# Patient Record
Sex: Male | Born: 1975 | Race: Black or African American | Hispanic: No | Marital: Married | Smoking: Former smoker
Health system: Southern US, Community
[De-identification: ages and names within clinical notes are randomized; demographics above are authoritative.]

## PROBLEM LIST (undated history)

## (undated) DIAGNOSIS — I5022 Chronic systolic (congestive) heart failure: Secondary | ICD-10-CM

## (undated) DIAGNOSIS — I10 Essential (primary) hypertension: Secondary | ICD-10-CM

## (undated) DIAGNOSIS — R599 Enlarged lymph nodes, unspecified: Secondary | ICD-10-CM

## (undated) DIAGNOSIS — N183 Chronic kidney disease, stage 3 unspecified: Secondary | ICD-10-CM

## (undated) DIAGNOSIS — G4730 Sleep apnea, unspecified: Secondary | ICD-10-CM

## (undated) HISTORY — PX: FINGER SURGERY: SHX640

---

## 2005-03-02 ENCOUNTER — Emergency Department (HOSPITAL_COMMUNITY): Admission: EM | Admit: 2005-03-02 | Discharge: 2005-03-02 | Payer: Self-pay | Admitting: Emergency Medicine

## 2009-03-05 ENCOUNTER — Encounter: Payer: Self-pay | Admitting: Family Medicine

## 2009-03-05 LAB — CONVERTED CEMR LAB
Cholesterol: 170 mg/dL
Creatinine, Ser: 1.25 mg/dL
HCT: 43.6 %
HDL: 31 mg/dL
LDL Cholesterol: 89 mg/dL
Platelets: 320 10*3/uL

## 2009-03-07 ENCOUNTER — Emergency Department (HOSPITAL_COMMUNITY): Admission: EM | Admit: 2009-03-07 | Discharge: 2009-03-07 | Payer: Self-pay | Admitting: Emergency Medicine

## 2009-03-28 ENCOUNTER — Encounter (INDEPENDENT_AMBULATORY_CARE_PROVIDER_SITE_OTHER): Payer: Self-pay | Admitting: *Deleted

## 2009-04-10 ENCOUNTER — Encounter: Payer: Self-pay | Admitting: Family Medicine

## 2009-04-10 ENCOUNTER — Ambulatory Visit: Payer: Self-pay | Admitting: Family Medicine

## 2009-04-10 DIAGNOSIS — F17200 Nicotine dependence, unspecified, uncomplicated: Secondary | ICD-10-CM

## 2009-04-10 DIAGNOSIS — E669 Obesity, unspecified: Secondary | ICD-10-CM | POA: Insufficient documentation

## 2009-04-10 DIAGNOSIS — I10 Essential (primary) hypertension: Secondary | ICD-10-CM

## 2009-04-10 DIAGNOSIS — L723 Sebaceous cyst: Secondary | ICD-10-CM

## 2009-04-10 DIAGNOSIS — G4700 Insomnia, unspecified: Secondary | ICD-10-CM | POA: Insufficient documentation

## 2009-04-10 LAB — CONVERTED CEMR LAB
CO2: 28 meq/L (ref 19–32)
Chloride: 106 meq/L (ref 96–112)
Creatinine, Ser: 1.41 mg/dL (ref 0.40–1.50)

## 2009-08-28 ENCOUNTER — Ambulatory Visit: Payer: Self-pay | Admitting: Family Medicine

## 2009-08-28 ENCOUNTER — Encounter: Payer: Self-pay | Admitting: Family Medicine

## 2009-08-28 DIAGNOSIS — E781 Pure hyperglyceridemia: Secondary | ICD-10-CM

## 2009-08-28 LAB — CONVERTED CEMR LAB
Chloride: 103 meq/L (ref 96–112)
Cholesterol: 248 mg/dL
Potassium: 3.3 meq/L — ABNORMAL LOW (ref 3.5–5.3)
Sodium: 142 meq/L (ref 135–145)

## 2009-08-29 ENCOUNTER — Encounter: Payer: Self-pay | Admitting: Family Medicine

## 2010-02-13 ENCOUNTER — Emergency Department (HOSPITAL_BASED_OUTPATIENT_CLINIC_OR_DEPARTMENT_OTHER): Admission: EM | Admit: 2010-02-13 | Discharge: 2010-02-14 | Payer: Self-pay | Admitting: Emergency Medicine

## 2011-01-23 ENCOUNTER — Encounter: Payer: Self-pay | Admitting: *Deleted

## 2011-06-15 ENCOUNTER — Inpatient Hospital Stay (INDEPENDENT_AMBULATORY_CARE_PROVIDER_SITE_OTHER)
Admission: RE | Admit: 2011-06-15 | Discharge: 2011-06-15 | Disposition: A | Discharge status: Home / Self Care | Payer: Self-pay | Source: Ambulatory Visit | Attending: Emergency Medicine | Admitting: Emergency Medicine

## 2011-06-15 ENCOUNTER — Ambulatory Visit (INDEPENDENT_AMBULATORY_CARE_PROVIDER_SITE_OTHER): Payer: Self-pay

## 2011-06-15 DIAGNOSIS — K122 Cellulitis and abscess of mouth: Secondary | ICD-10-CM

## 2012-01-18 ENCOUNTER — Encounter (HOSPITAL_COMMUNITY): Payer: Self-pay | Admitting: Emergency Medicine

## 2012-01-18 ENCOUNTER — Encounter (HOSPITAL_COMMUNITY): Payer: Self-pay

## 2012-01-18 ENCOUNTER — Emergency Department (INDEPENDENT_AMBULATORY_CARE_PROVIDER_SITE_OTHER)
Admission: EM | Admit: 2012-01-18 | Discharge: 2012-01-18 | Disposition: A | Discharge status: Home / Self Care | Payer: Self-pay | Source: Home / Self Care | Attending: Emergency Medicine | Admitting: Emergency Medicine

## 2012-01-18 ENCOUNTER — Emergency Department (HOSPITAL_COMMUNITY)
Admission: EM | Admit: 2012-01-18 | Discharge: 2012-01-18 | Disposition: A | Discharge status: Home / Self Care | Payer: Self-pay | Attending: Emergency Medicine | Admitting: Emergency Medicine

## 2012-01-18 DIAGNOSIS — I10 Essential (primary) hypertension: Secondary | ICD-10-CM

## 2012-01-18 DIAGNOSIS — F17200 Nicotine dependence, unspecified, uncomplicated: Secondary | ICD-10-CM | POA: Insufficient documentation

## 2012-01-18 DIAGNOSIS — I169 Hypertensive crisis, unspecified: Secondary | ICD-10-CM

## 2012-01-18 DIAGNOSIS — R040 Epistaxis: Secondary | ICD-10-CM | POA: Insufficient documentation

## 2012-01-18 HISTORY — DX: Essential (primary) hypertension: I10

## 2012-01-18 LAB — BASIC METABOLIC PANEL
Chloride: 103 mEq/L (ref 96–112)
GFR calc non Af Amer: 57 mL/min — ABNORMAL LOW (ref >90)
Glucose, Bld: 87 mg/dL (ref 70–99)
Potassium: 3.6 mEq/L (ref 3.5–5.1)
Sodium: 143 mEq/L (ref 135–145)

## 2012-01-18 LAB — POCT I-STAT, CHEM 8
BUN: 13 mg/dL (ref 6–23)
Chloride: 103 mEq/L (ref 96–112)
Creatinine, Ser: 1.5 mg/dL — ABNORMAL HIGH (ref 0.50–1.35)
Potassium: 3 mEq/L — ABNORMAL LOW (ref 3.5–5.1)
Sodium: 145 mEq/L (ref 135–145)
TCO2: 28 mmol/L (ref 0–100)

## 2012-01-18 MED ORDER — LISINOPRIL 20 MG PO TABS: 20.0000 mg | ORAL_TABLET | Freq: Every day | ORAL | Status: DC

## 2012-01-18 MED ORDER — LISINOPRIL 10 MG PO TABS
10.0000 mg | ORAL_TABLET | ORAL | Status: AC
  Administered 2012-01-18: 10 mg via ORAL
  Filled 2012-01-18: qty 1

## 2012-01-18 NOTE — ED Notes (Signed)
nosebleeding  Since 12 noon his bp is high

## 2012-01-18 NOTE — ED Notes (Signed)
C/o nosebleed that started this am- states noted blood from both nares.  States it stopped while in [place].  BP is elevated, states has not had blood pressure in more than a year.  Denies headache or weakness.

## 2012-01-18 NOTE — ED Provider Notes (Signed)
History     CSN: [id number]  Arrival date & time 01/18/12  1344   First MD Initiated Contact with Patient 01/18/12 1636      Chief Complaint  Patient presents with  . Epistaxis    (Consider location/radiation/quality/duration/timing/severity/associated sxs/prior treatment) Patient is a 36 y.o. male presenting with nosebleeds. The history is provided by the patient and medical records.  Epistaxis    the patient is a 36 year old, male, with a history of hypertension.  He is no longer taking his antihypertensive medications.  He stated that he had copious bleeding from his nose.  This morning.  It is stopped up.  He denies shortness breath, or lightheadedness.  He is not taking any anticoagulants.  He is asymptomatic right now.  Past Medical History  Diagnosis Date  . Hypertension     Past Surgical History  Procedure Date  . Joint replacement     No family history on file.  History  Substance Use Topics  . Smoking status: Current Everyday Smoker  . Smokeless tobacco: Not on file  . Alcohol Use: No      Review of Systems  Constitutional: Negative for fever and chills.  HENT: Positive for nosebleeds.   Respiratory: Negative for cough and shortness of breath.   Cardiovascular: Negative for palpitations.  Gastrointestinal: Negative for nausea and vomiting.  Neurological: Negative for weakness, light-headedness and headaches.  Hematological: Does not bruise/bleed easily.  All other systems reviewed and are negative.    Allergies  Shellfish allergy  Home Medications   Current Outpatient Rx  Name Route Sig Dispense Refill  . IBUPROFEN 200 MG PO TABS Oral Take 400 mg by mouth every 6 (six) hours as needed. For pain      BP 168/109  Pulse 63  Temp(Src) 98.3 F (36.8 C) (Oral)  Resp 15  SpO2 98%  Physical Exam  Vitals reviewed. Constitutional: He is oriented to person, place, and time. He appears well-developed and well-nourished. No distress.  HENT:    Head: Normocephalic and atraumatic.       No epistaxis at this time  Eyes: EOM are normal. Pupils are equal, round, and reactive to light.  Neck: Normal range of motion. Neck supple.  Cardiovascular: Normal rate, regular rhythm and normal heart sounds.   No murmur heard. Pulmonary/Chest: Effort normal and breath sounds normal. No respiratory distress. He has no wheezes. He has no rales.  Abdominal: Soft. Bowel sounds are normal. He exhibits no distension and no mass. There is no tenderness. There is no rebound and no guarding.  Musculoskeletal: Normal range of motion. He exhibits no edema and no tenderness.  Neurological: He is alert and oriented to person, place, and time. No cranial nerve deficit.  Skin: Skin is warm and dry. He is not diaphoretic.  Psychiatric: He has a normal mood and affect. His behavior is normal.    ED Course  Procedures (including critical care time) 36 year old, male, presents with epistaxis.  This morning, which has resolved.  He is a normal examination.  At this time except for significant hypertension.  We will give him an ice, antihypertensive, now.  Check blood chemistry, to look and see if there is any damage to his kidney function.  There is no other evidence of endorgan damage.   Labs Reviewed  BASIC METABOLIC PANEL   No results found.   No diagnosis found.    MDM  Epistaxis, resolved Hypertension.   Mild renal insufficiency  Nicholes Stairs, MD 01/18/12 1737

## 2012-01-18 NOTE — ED Provider Notes (Signed)
History     CSN: [id number]  Arrival date & time 01/18/12  1154   First MD Initiated Contact with Patient 01/18/12 1214      Chief Complaint  Patient presents with  . Epistaxis    (Consider location/radiation/quality/duration/timing/severity/associated sxs/prior treatment) HPI Comments: Bradley Wilson presented to urgent care this morning complaining of right-sided nosebleeds. Describes yesterday while at work he felt that his nose was dry and crusted material and he blew his nose and after that he started bleeding from his right side which subsided with pressure. He worked the night shift with no further problems none until this morning the leg blow started bleeding once again from the right side of his nose again " it already stopped as he arrived to urgent care this won't.  [name] is noted to be hypertensive on further questioning describes he was seen at this urgent care about a year ago and prescribed medicines for 30 days which helped his blood pressure because it came down. No followup with any primary care doctor or return visits after that. "I have been feeling fine"  The patient denies any chest pains dizziness weakness or numbness sensation or speech problems.  Patient is a 36 y.o. male presenting with nosebleeds. The history is provided by the patient.  Epistaxis  This is a new problem. The problem has been gradually improving. The bleeding has been from the right nare. He has tried nothing (PINCHES HIS NOSE) for the symptoms. The treatment provided moderate relief. His past medical history is significant for nose-picking. His past medical history does not include bleeding disorder or frequent nosebleeds.    Past Medical History  Diagnosis Date  . Hypertension     History reviewed. No pertinent past surgical history.  No family history on file.  History  Substance Use Topics  . Smoking status: Current Everyday Smoker  . Smokeless tobacco: Not on file  . Alcohol Use:  No      Review of Systems  Constitutional: Negative for fever, appetite change and fatigue.  HENT: Positive for nosebleeds.   Respiratory: Negative for cough, shortness of breath and wheezing.   Cardiovascular: Negative for chest pain, palpitations and leg swelling.  Skin: Negative for color change.    Allergies  Review of patient's allergies indicates no known allergies.  Home Medications   Current Outpatient Rx  Name Route Sig Dispense Refill  . HYDROCHLOROTHIAZIDE 25 MG PO TABS Oral Take 25 mg by mouth daily.      Marland Kitchen METOPROLOL SUCCINATE ER 100 MG PO TB24 Oral Take 100 mg by mouth daily.        BP 174/124  Pulse 95  Temp(Src) 98.4 F (36.9 C) (Oral)  Resp 18  SpO2 100%  Physical Exam  Constitutional: He appears well-developed and well-nourished. No distress.  HENT:  Head: Normocephalic.  Nose: Mucosal edema present.    Mouth/Throat: Uvula is midline. No oropharyngeal exudate.    Eyes: Conjunctivae are normal.  Neck: Neck supple. No JVD present.  Cardiovascular: Normal rate.   Pulmonary/Chest: Effort normal and breath sounds normal.  Lymphadenopathy:    He has no cervical adenopathy.    ED Course  Procedures (including critical care time)  Labs Reviewed - No data to display No results found.   No diagnosis found.    MDM   Right-sided epistaxis subsided with coexistent hypertensive crisis on a patient with non-compliance or adherence to treatment. Patient denies any cardiovascular symptoms or complaints nor any neurological symptoms  Bradley Molly, MD 01/18/12 1252

## 2013-01-23 ENCOUNTER — Emergency Department (HOSPITAL_COMMUNITY): Payer: Self-pay

## 2013-01-23 ENCOUNTER — Encounter (HOSPITAL_COMMUNITY): Payer: Self-pay | Admitting: *Deleted

## 2013-01-23 ENCOUNTER — Emergency Department (HOSPITAL_COMMUNITY)
Admission: EM | Admit: 2013-01-23 | Discharge: 2013-01-23 | Disposition: A | Discharge status: Home / Self Care | Payer: Self-pay | Attending: Emergency Medicine | Admitting: Emergency Medicine

## 2013-01-23 DIAGNOSIS — R0989 Other specified symptoms and signs involving the circulatory and respiratory systems: Secondary | ICD-10-CM | POA: Insufficient documentation

## 2013-01-23 DIAGNOSIS — Z87891 Personal history of nicotine dependence: Secondary | ICD-10-CM | POA: Insufficient documentation

## 2013-01-23 DIAGNOSIS — N289 Disorder of kidney and ureter, unspecified: Secondary | ICD-10-CM | POA: Insufficient documentation

## 2013-01-23 DIAGNOSIS — I10 Essential (primary) hypertension: Secondary | ICD-10-CM | POA: Insufficient documentation

## 2013-01-23 DIAGNOSIS — J45901 Unspecified asthma with (acute) exacerbation: Secondary | ICD-10-CM | POA: Insufficient documentation

## 2013-01-23 DIAGNOSIS — R0609 Other forms of dyspnea: Secondary | ICD-10-CM | POA: Insufficient documentation

## 2013-01-23 DIAGNOSIS — J45909 Unspecified asthma, uncomplicated: Secondary | ICD-10-CM

## 2013-01-23 LAB — POCT I-STAT, CHEM 8
BUN: 16 mg/dL (ref 6–23)
Chloride: 104 mEq/L (ref 96–112)
Glucose, Bld: 93 mg/dL (ref 70–99)
HCT: 42 % (ref 39.0–52.0)
Potassium: 3.3 mEq/L — ABNORMAL LOW (ref 3.5–5.1)

## 2013-01-23 MED ORDER — IPRATROPIUM BROMIDE 0.02 % IN SOLN
0.5000 mg | Freq: Once | RESPIRATORY_TRACT | Status: AC
  Administered 2013-01-23: 0.5 mg via RESPIRATORY_TRACT
  Filled 2013-01-23: qty 2.5

## 2013-01-23 MED ORDER — AMLODIPINE BESYLATE 10 MG PO TABS: 10.0000 mg | ORAL_TABLET | Freq: Every day | ORAL | Status: DC

## 2013-01-23 MED ORDER — CLONIDINE HCL 0.1 MG PO TABS
0.1000 mg | ORAL_TABLET | Freq: Once | ORAL | Status: AC
  Administered 2013-01-23: 0.1 mg via ORAL
  Filled 2013-01-23: qty 1

## 2013-01-23 MED ORDER — ALBUTEROL SULFATE (5 MG/ML) 0.5% IN NEBU
5.0000 mg | INHALATION_SOLUTION | Freq: Once | RESPIRATORY_TRACT | Status: AC
  Administered 2013-01-23: 5 mg via RESPIRATORY_TRACT
  Filled 2013-01-23: qty 1

## 2013-01-23 MED ORDER — HYDROCHLOROTHIAZIDE 25 MG PO TABS: 25.0000 mg | ORAL_TABLET | Freq: Every day | ORAL | Status: DC

## 2013-01-23 MED ORDER — ALBUTEROL SULFATE HFA 108 (90 BASE) MCG/ACT IN AERS
2.0000 | INHALATION_SPRAY | RESPIRATORY_TRACT | Status: DC | PRN
  Administered 2013-01-23: 2 via RESPIRATORY_TRACT
  Filled 2013-01-23: qty 6.7

## 2013-01-23 NOTE — ED Notes (Signed)
Patient transported to X-ray 

## 2013-01-23 NOTE — ED Notes (Signed)
Pt reports wheezing and "feeling like I'm breathing through a straw." Symptoms x1 day. Pt in NAD, speaking complete sentences. Pt noted to be extremely hypertensive in triage. Sts he has a Hx of HTN, not on meds because he has to come to ED to get refills. Has not had any since July 2013. Pt's wife also reports when pt is sleeping on back he will quit breathing for 5-6 seconds, then shake leg hard and start breathing again.

## 2013-01-23 NOTE — ED Notes (Signed)
Pt states for the past month has had a productive cough w/ mucus, states took mucinex and went away then this morning started having shortness of breath again and felt like he was breathing through a straw, pt states also had wheezing, denies pain. Pt states has a hx of HTN, states has not taken his medication d/t no refills and not seeing his doctor. Pt in NAD.

## 2013-01-23 NOTE — ED Provider Notes (Signed)
History     CSN: [id number]  Arrival date & time 01/23/13  1009   First MD Initiated Contact with Patient 01/23/13 1030      Chief Complaint  Patient presents with  . Shortness of Breath    (Consider location/radiation/quality/duration/timing/severity/associated sxs/prior treatment) HPI  Patient reports about a month ago he had trouble breathing when he had a "chest cold". He states he took Mucinex and he got better. He states today he acutely got short of breath with wheezing and felt "like I'm breathing through a straw". He denies coughing or fever. He states he had a tightness in his epigastric area which he stayed restricted his breathing. He states on the way to the ED he did have the window open with the cold air, however when he shut the window in brief the warm air in the car his breathing got better.  Patient also reports he has been out of his blood pressure medicine since last July. He states he does not have insurance to have a PCP. He denies headache, swelling in his legs, or any difficulty urinating.  PCP none  Past Medical History  Diagnosis Date  . Hypertension     History reviewed. No pertinent past surgical history.  No family history on file.  History  Substance Use Topics  . Smoking status: Former Smoker    Quit date: 03/23/2012  . Smokeless tobacco: Not on file  . Alcohol Use: No   Employed Lives at home Lives with spouse   Review of Systems  All other systems reviewed and are negative.    Allergies  Shellfish allergy  Home Medications   none  BP 215/146  Pulse 103  Temp(Src) 98.4 F (36.9 C) (Oral)  SpO2 98%  Vital signs normal except hypertension, tachycardia    Physical Exam  Nursing note and vitals reviewed. Constitutional: He is oriented to person, place, and time. He appears well-developed and well-nourished.  Non-toxic appearance. He does not appear ill. No distress.  Obese   HENT:  Head: Normocephalic and atraumatic.   Right Ear: External ear normal.  Left Ear: External ear normal.  Nose: Nose normal. No mucosal edema or rhinorrhea.  Mouth/Throat: Oropharynx is clear and moist and mucous membranes are normal. No dental abscesses or edematous.  Eyes: Conjunctivae and EOM are normal. Pupils are equal, round, and reactive to light.  Neck: Normal range of motion and full passive range of motion without pain. Neck supple.  Cardiovascular: Normal rate, regular rhythm and normal heart sounds.  Exam reveals no gallop and no friction rub.   No murmur heard. Pulmonary/Chest: Effort normal. No respiratory distress. He has decreased breath sounds. He has no wheezes. He has rhonchi. He has no rales. He exhibits no tenderness and no crepitus.  Abdominal: Soft. Normal appearance and bowel sounds are normal. He exhibits no distension. There is no tenderness. There is no rebound and no guarding.  Musculoskeletal: Normal range of motion. He exhibits no edema and no tenderness.  Moves all extremities well.   Neurological: He is alert and oriented to person, place, and time. He has normal strength. No cranial nerve deficit.  Skin: Skin is warm, dry and intact. No rash noted. No erythema. No pallor.  Psychiatric: He has a normal mood and affect. His speech is normal and behavior is normal. His mood appears not anxious.    ED Course  Procedures (including critical care time)  Medications  albuterol (PROVENTIL HFA;VENTOLIN HFA) 108 (90 BASE) MCG/ACT inhaler 2  puff (not administered)  cloNIDine (CATAPRES) tablet 0.1 mg (0.1 mg Oral Given 01/23/13 1056)  albuterol (PROVENTIL) (5 MG/ML) 0.5% nebulizer solution 5 mg (5 mg Nebulization Given 01/23/13 1123)  ipratropium (ATROVENT) nebulizer solution 0.5 mg (0.5 mg Nebulization Given 01/23/13 1124)   Recheck BP 192/135, states he feels better. Lungs are clear with improved air movement. Have discussed importance of taking his medication and to get a PCP. Pt does not want to be admitted  at this time.   Results for orders placed during the hospital encounter of 01/23/13  POCT I-STAT, CHEM 8      Result Value Range   Sodium 143  135 - 145 mEq/L   Potassium 3.3 (*) 3.5 - 5.1 mEq/L   Chloride 104  96 - 112 mEq/L   BUN 16  6 - 23 mg/dL   Creatinine, Ser 1.47 (*) 0.50 - 1.35 mg/dL   Glucose, Bld 93  70 - 99 mg/dL   Calcium, Ion 8.29 (*) 1.12 - 1.23 mmol/L   TCO2 30  0 - 100 mmol/L   Hemoglobin 14.3  13.0 - 17.0 g/dL   HCT 56.2  13.0 - 86.5 %   Laboratory interpretation all normal except some worsening of his renal insuffic   Dg Chest 2 View  01/23/2013  *RADIOLOGY REPORT*  Clinical Data: Hypertension.  Smoking history.  Chest tightness. Short of breath.  CHEST - 2 VIEW  Comparison: None.  Findings: The heart is enlarged.  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  No effusions.  No bony abnormalities.  IMPRESSION: Cardiomegaly, pulmonary venous hypertension and early interstitial edema.   Original Report Authenticated By: Paulina Fusi, M.D.       Date: 01/23/2013  Rate: 96  Rhythm: normal sinus rhythm  QRS Axis: normal  Intervals: QT prolonged  ST/T Wave abnormalities: nonspecific ST/T changes  Conduction Disutrbances:LVH with strain  Narrative Interpretation:   Old EKG Reviewed: none available    1. Hypertension   2. Renal insufficiency   3. Asthma    New Prescriptions   AMLODIPINE (NORVASC) 10 MG TABLET    Take 1 tablet (10 mg total) by mouth daily.   HYDROCHLOROTHIAZIDE (HYDRODIURIL) 25 MG TABLET    Take 1 tablet (25 mg total) by mouth daily.    Plan discharge   Devoria Albe, MD, FACEP    MDM          Ward Givens, MD 01/23/13 1346

## 2013-07-29 IMAGING — CR DG CHEST 2V
3 series · 3 of 3 positions shown · non-contrast
Comparison: None.

CLINICAL DATA: Hypertension.  Smoking history.  Chest tightness.
Short of breath.

CHEST - 2 VIEW

[w chest pa]
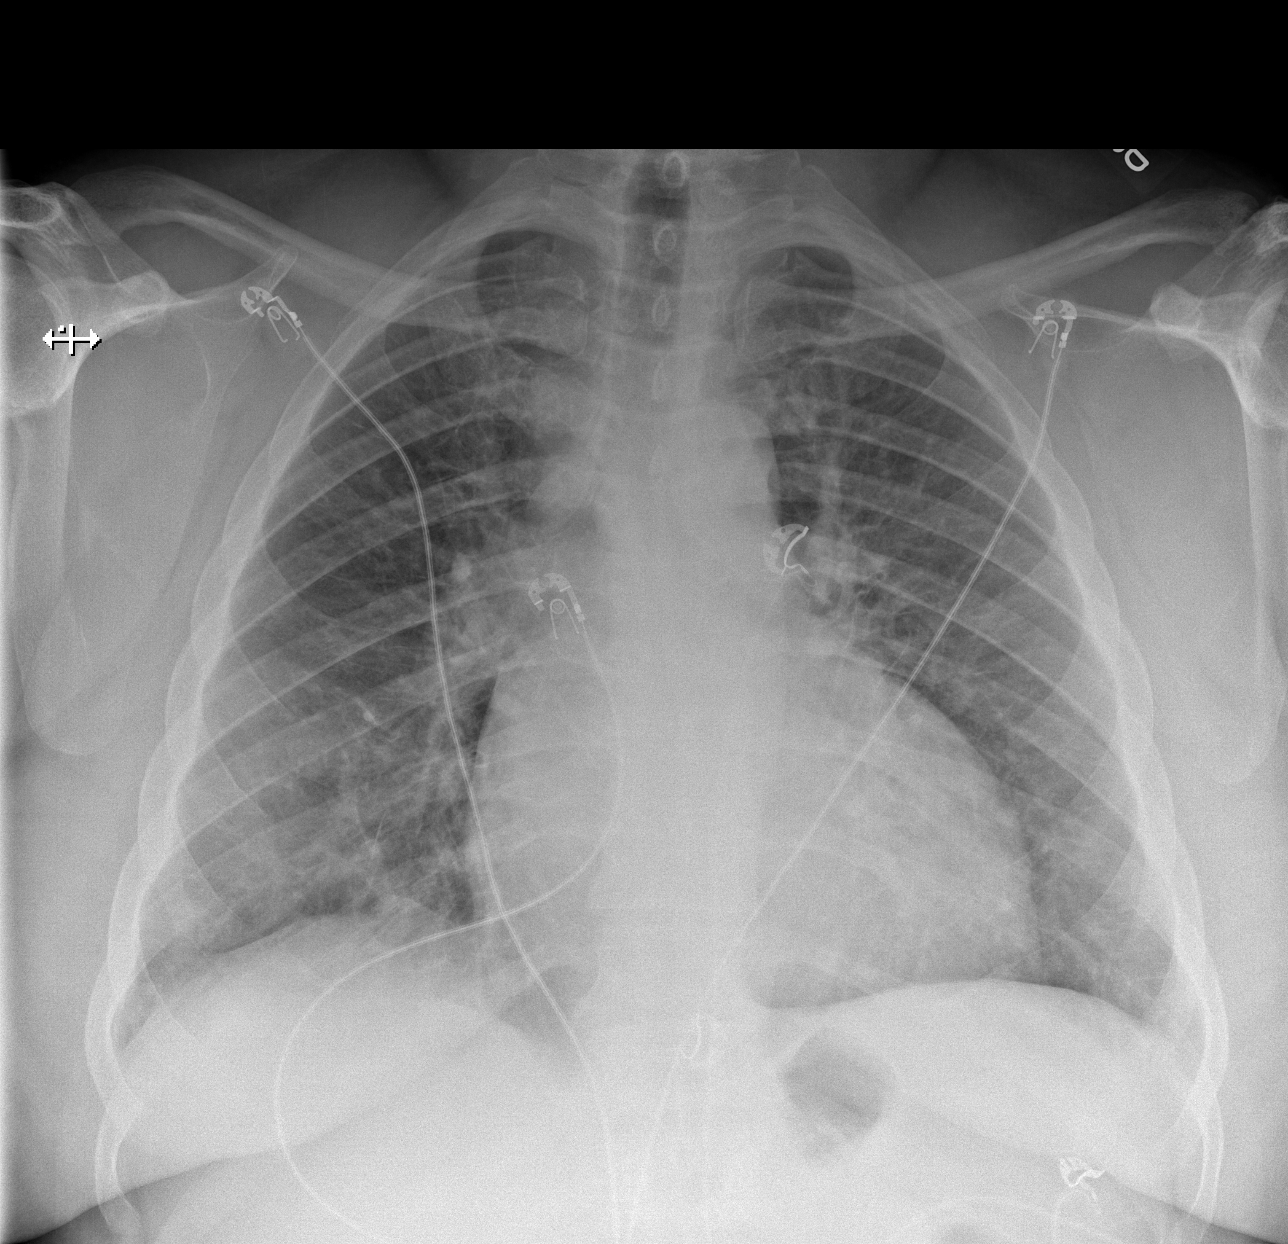

[w chest lat (1 of 2)]
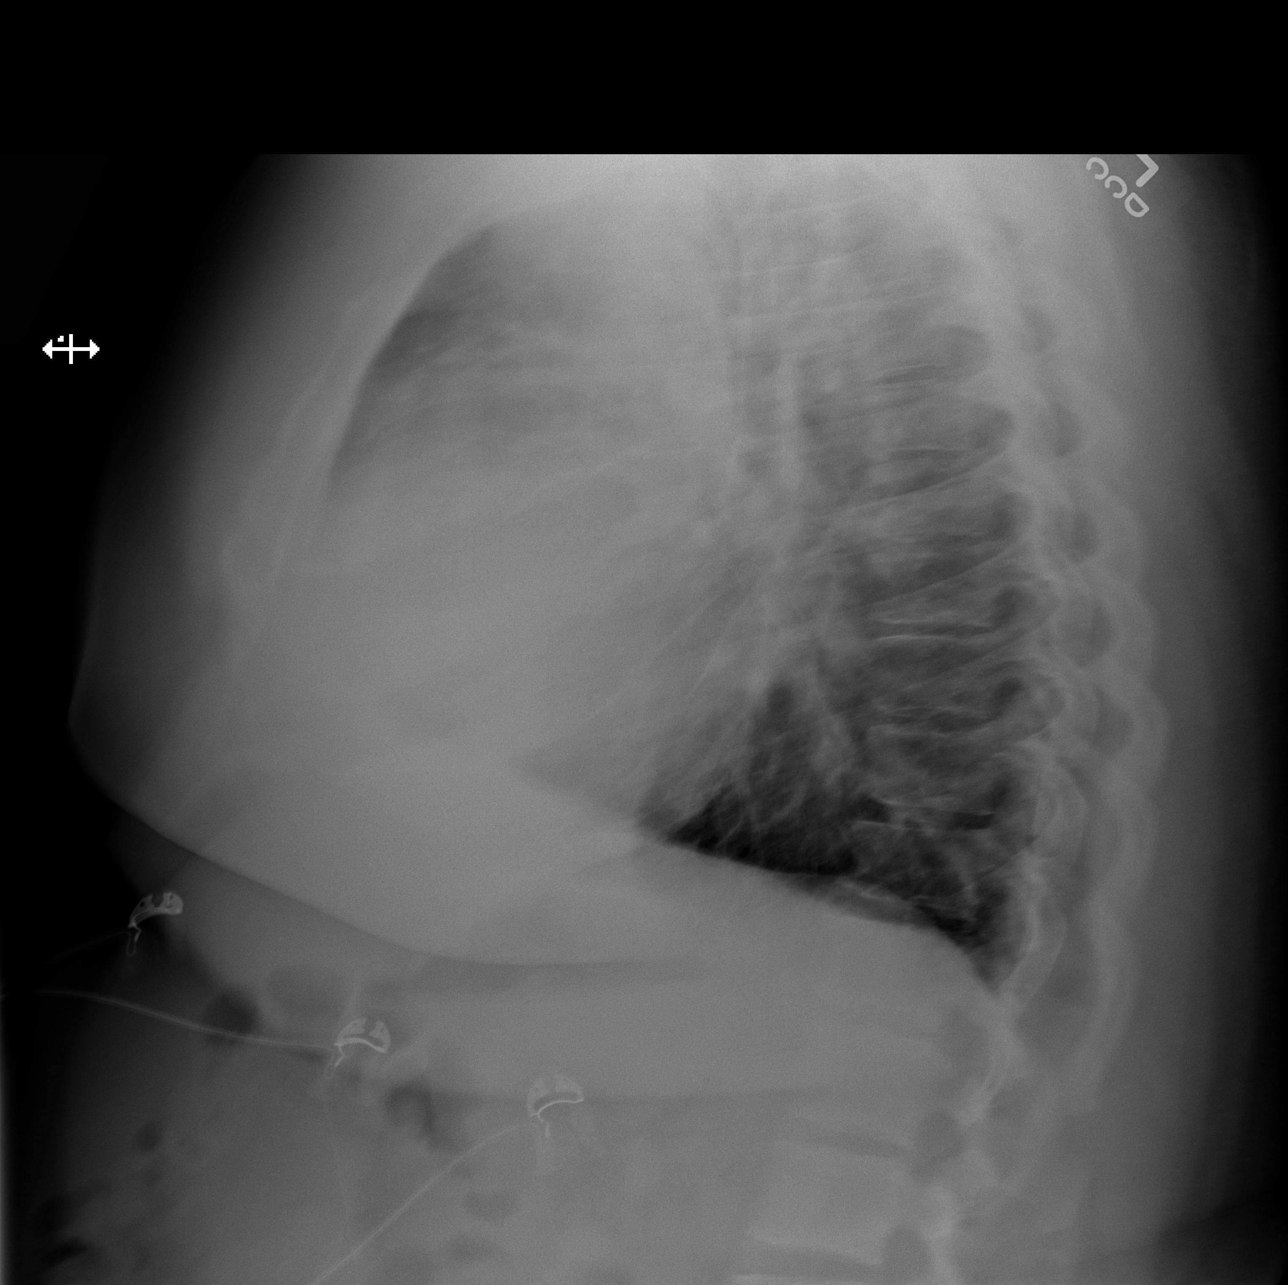

[w chest lat (2 of 2)]
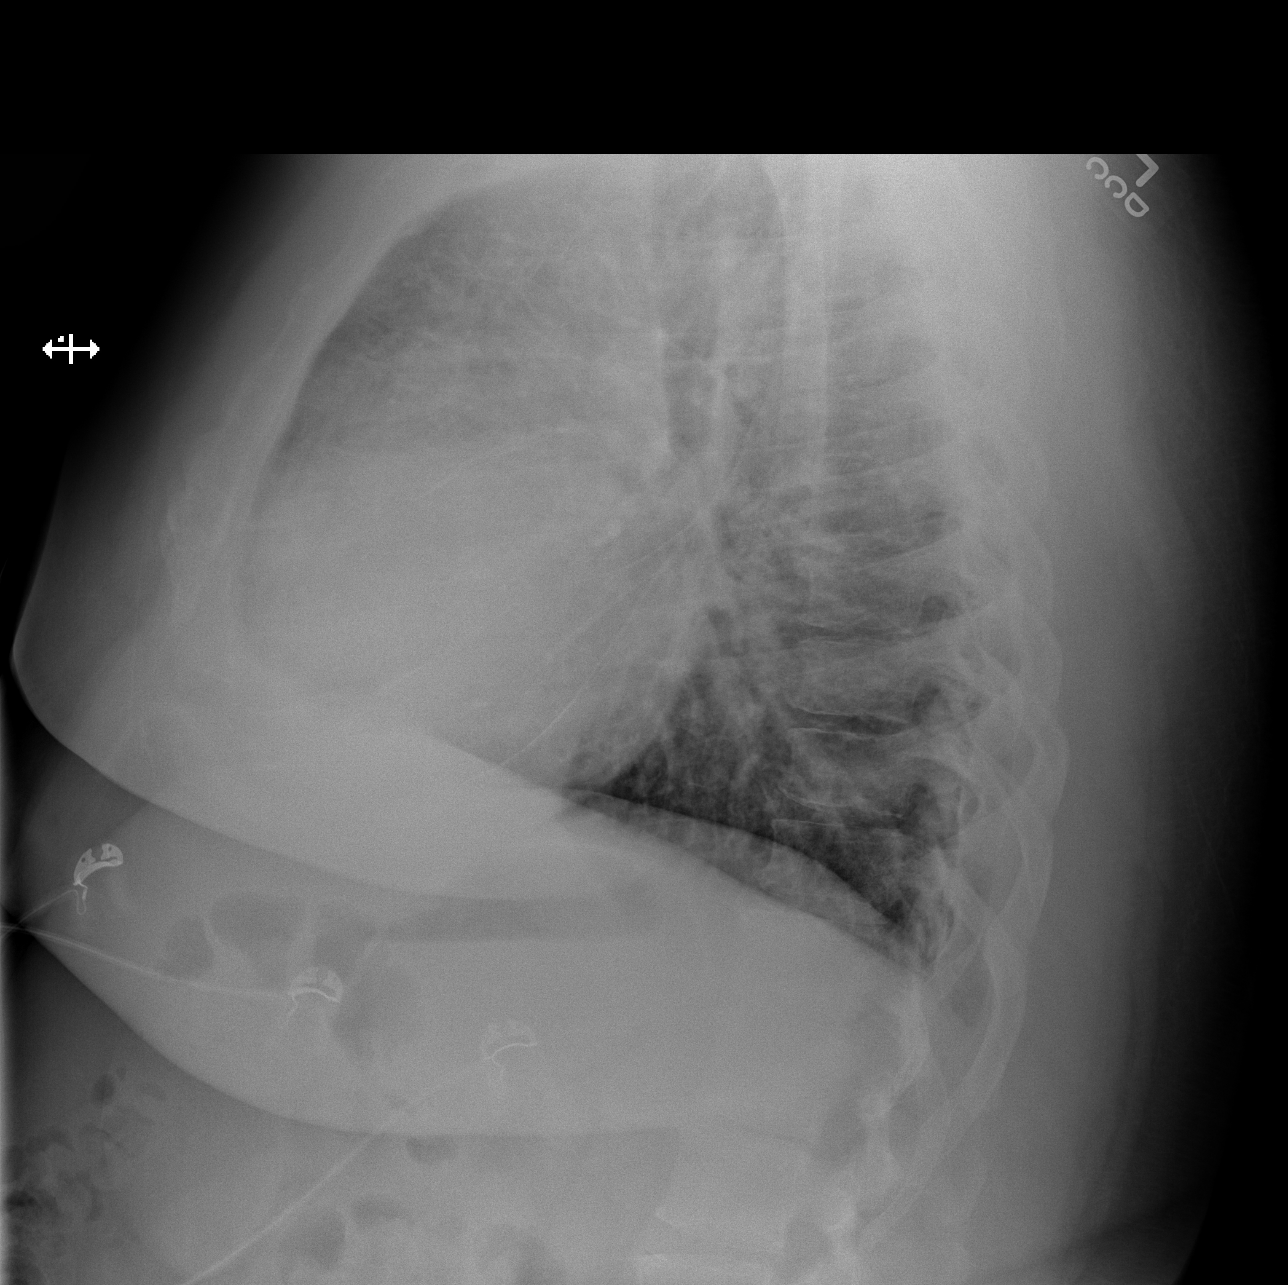

[3 of 3 positions shown; findings below may reference images not displayed]

FINDINGS: The heart is enlarged.  There is pulmonary venous
hypertension with early interstitial edema.  No effusions.  No bony
abnormalities.
IMPRESSION: Cardiomegaly, pulmonary venous hypertension and early interstitial
edema.

## 2013-12-05 ENCOUNTER — Emergency Department (HOSPITAL_COMMUNITY): Payer: Medicaid Other

## 2013-12-05 ENCOUNTER — Encounter (HOSPITAL_COMMUNITY): Payer: Self-pay | Admitting: Emergency Medicine

## 2013-12-05 ENCOUNTER — Inpatient Hospital Stay (HOSPITAL_COMMUNITY)
Admission: EM | Admit: 2013-12-05 | Discharge: 2013-12-10 | DRG: 286 | Disposition: A | Discharge status: Home / Self Care | Payer: Medicaid Other | Attending: Internal Medicine | Admitting: Internal Medicine

## 2013-12-05 DIAGNOSIS — Z91199 Patient's noncompliance with other medical treatment and regimen due to unspecified reason: Secondary | ICD-10-CM

## 2013-12-05 DIAGNOSIS — Z6841 Body Mass Index (BMI) 40.0 and over, adult: Secondary | ICD-10-CM

## 2013-12-05 DIAGNOSIS — I429 Cardiomyopathy, unspecified: Secondary | ICD-10-CM

## 2013-12-05 DIAGNOSIS — R601 Generalized edema: Secondary | ICD-10-CM

## 2013-12-05 DIAGNOSIS — I5031 Acute diastolic (congestive) heart failure: Secondary | ICD-10-CM | POA: Insufficient documentation

## 2013-12-05 DIAGNOSIS — I10 Essential (primary) hypertension: Secondary | ICD-10-CM

## 2013-12-05 DIAGNOSIS — G4733 Obstructive sleep apnea (adult) (pediatric): Secondary | ICD-10-CM | POA: Diagnosis present

## 2013-12-05 DIAGNOSIS — F17200 Nicotine dependence, unspecified, uncomplicated: Secondary | ICD-10-CM

## 2013-12-05 DIAGNOSIS — R0989 Other specified symptoms and signs involving the circulatory and respiratory systems: Secondary | ICD-10-CM

## 2013-12-05 DIAGNOSIS — E781 Pure hyperglyceridemia: Secondary | ICD-10-CM | POA: Diagnosis present

## 2013-12-05 DIAGNOSIS — R0609 Other forms of dyspnea: Secondary | ICD-10-CM

## 2013-12-05 DIAGNOSIS — Z23 Encounter for immunization: Secondary | ICD-10-CM

## 2013-12-05 DIAGNOSIS — I428 Other cardiomyopathies: Secondary | ICD-10-CM | POA: Diagnosis present

## 2013-12-05 DIAGNOSIS — Z9119 Patient's noncompliance with other medical treatment and regimen: Secondary | ICD-10-CM

## 2013-12-05 DIAGNOSIS — N179 Acute kidney failure, unspecified: Secondary | ICD-10-CM | POA: Diagnosis present

## 2013-12-05 DIAGNOSIS — I161 Hypertensive emergency: Secondary | ICD-10-CM

## 2013-12-05 DIAGNOSIS — E669 Obesity, unspecified: Secondary | ICD-10-CM

## 2013-12-05 DIAGNOSIS — I130 Hypertensive heart and chronic kidney disease with heart failure and stage 1 through stage 4 chronic kidney disease, or unspecified chronic kidney disease: Principal | ICD-10-CM | POA: Diagnosis present

## 2013-12-05 DIAGNOSIS — I5021 Acute systolic (congestive) heart failure: Secondary | ICD-10-CM

## 2013-12-05 DIAGNOSIS — N183 Chronic kidney disease, stage 3 unspecified: Secondary | ICD-10-CM | POA: Diagnosis present

## 2013-12-05 DIAGNOSIS — Z87891 Personal history of nicotine dependence: Secondary | ICD-10-CM

## 2013-12-05 DIAGNOSIS — R0600 Dyspnea, unspecified: Secondary | ICD-10-CM

## 2013-12-05 DIAGNOSIS — I509 Heart failure, unspecified: Secondary | ICD-10-CM | POA: Diagnosis present

## 2013-12-05 DIAGNOSIS — Z8249 Family history of ischemic heart disease and other diseases of the circulatory system: Secondary | ICD-10-CM

## 2013-12-05 HISTORY — DX: Enlarged lymph nodes, unspecified: R59.9

## 2013-12-05 HISTORY — DX: Chronic systolic (congestive) heart failure: I50.22

## 2013-12-05 HISTORY — DX: Sleep apnea, unspecified: G47.30

## 2013-12-05 HISTORY — DX: Chronic kidney disease, stage 3 unspecified: N18.30

## 2013-12-05 HISTORY — DX: Chronic kidney disease, stage 3 (moderate): N18.3

## 2013-12-05 HISTORY — DX: Morbid (severe) obesity due to excess calories: E66.01

## 2013-12-05 LAB — COMPREHENSIVE METABOLIC PANEL
ALT: 37 U/L (ref 0–53)
AST: 35 U/L (ref 0–37)
Albumin: 3.6 g/dL (ref 3.5–5.2)
Alkaline Phosphatase: 56 U/L (ref 39–117)
BUN: 21 mg/dL (ref 6–23)
CO2: 28 mEq/L (ref 19–32)
Calcium: 8.9 mg/dL (ref 8.4–10.5)
Chloride: 100 mEq/L (ref 96–112)
Creatinine, Ser: 1.73 mg/dL — ABNORMAL HIGH (ref 0.50–1.35)
GFR calc Af Amer: 56 mL/min — ABNORMAL LOW (ref >90)
GFR calc non Af Amer: 48 mL/min — ABNORMAL LOW (ref >90)
Glucose, Bld: 96 mg/dL (ref 70–99)
Potassium: 3.9 mEq/L (ref 3.7–5.3)
Sodium: 141 mEq/L (ref 137–147)
Total Bilirubin: 0.6 mg/dL (ref 0.3–1.2)
Total Protein: 7.3 g/dL (ref 6.0–8.3)

## 2013-12-05 LAB — TROPONIN I: Troponin I: 0.34 ng/mL (ref <0.30)

## 2013-12-05 LAB — PRO B NATRIURETIC PEPTIDE: Pro B Natriuretic peptide (BNP): 2630 pg/mL — ABNORMAL HIGH (ref 0–125)

## 2013-12-05 LAB — CBC
HCT: 42.7 % (ref 39.0–52.0)
Hemoglobin: 14.1 g/dL (ref 13.0–17.0)
MCH: 27.9 pg (ref 26.0–34.0)
MCHC: 33 g/dL (ref 30.0–36.0)
MCHC: 33.4 g/dL (ref 30.0–36.0)
MCV: 84.6 fL (ref 78.0–100.0)
MCV: 84.3 fL (ref 78.0–100.0)
Platelets: 297 10*3/uL (ref 150–400)
Platelets: 330 10*3/uL (ref 150–400)
RBC: 5.05 MIL/uL (ref 4.22–5.81)
RDW: 14 % (ref 11.5–15.5)
RDW: 13.9 % (ref 11.5–15.5)
WBC: 9.8 10*3/uL (ref 4.0–10.5)
WBC: 9.9 10*3/uL (ref 4.0–10.5)

## 2013-12-05 LAB — BASIC METABOLIC PANEL
CO2: 30 mEq/L (ref 19–32)
Chloride: 102 mEq/L (ref 96–112)
GFR calc Af Amer: 63 mL/min — ABNORMAL LOW (ref >90)
Potassium: 3.5 mEq/L — ABNORMAL LOW (ref 3.7–5.3)
Sodium: 145 mEq/L (ref 137–147)

## 2013-12-05 LAB — MAGNESIUM: Magnesium: 2.2 mg/dL (ref 1.5–2.5)

## 2013-12-05 MED ORDER — ONDANSETRON HCL 4 MG/2ML IJ SOLN: 4.0000 mg | Freq: Four times a day (QID) | INTRAMUSCULAR | Status: DC | PRN

## 2013-12-05 MED ORDER — NITROGLYCERIN IN D5W 200-5 MCG/ML-% IV SOLN
2.0000 ug/min | Freq: Once | INTRAVENOUS | Status: AC
  Administered 2013-12-05: 20 ug/min via INTRAVENOUS
  Filled 2013-12-05: qty 250

## 2013-12-05 MED ORDER — ASPIRIN EC 81 MG PO TBEC
81.0000 mg | DELAYED_RELEASE_TABLET | Freq: Every day | ORAL | Status: DC
  Administered 2013-12-05 – 2013-12-10 (×6): 81 mg via ORAL
  Filled 2013-12-05 (×6): qty 1

## 2013-12-05 MED ORDER — CARVEDILOL 6.25 MG PO TABS: 6.2500 mg | ORAL_TABLET | Freq: Two times a day (BID) | ORAL | Status: DC

## 2013-12-05 MED ORDER — ZOLPIDEM TARTRATE 5 MG PO TABS: 5.0000 mg | ORAL_TABLET | Freq: Every evening | ORAL | Status: DC | PRN

## 2013-12-05 MED ORDER — ASPIRIN 81 MG PO CHEW
324.0000 mg | CHEWABLE_TABLET | Freq: Once | ORAL | Status: AC
  Administered 2013-12-05: 324 mg via ORAL
  Filled 2013-12-05: qty 4

## 2013-12-05 MED ORDER — LISINOPRIL 10 MG PO TABS
10.0000 mg | ORAL_TABLET | Freq: Every day | ORAL | Status: DC
  Administered 2013-12-06 – 2013-12-10 (×5): 10 mg via ORAL
  Filled 2013-12-05 (×5): qty 1

## 2013-12-05 MED ORDER — ACETAMINOPHEN 325 MG PO TABS
650.0000 mg | ORAL_TABLET | ORAL | Status: DC | PRN
  Administered 2013-12-05: 650 mg via ORAL
  Filled 2013-12-05: qty 2

## 2013-12-05 MED ORDER — NITROGLYCERIN IN D5W 200-5 MCG/ML-% IV SOLN
2.0000 ug/min | INTRAVENOUS | Status: DC
  Administered 2013-12-05: 10 ug/min via INTRAVENOUS

## 2013-12-05 MED ORDER — LABETALOL HCL 5 MG/ML IV SOLN
0.5000 mg/min | INTRAVENOUS | Status: DC
  Administered 2013-12-05: 1 mg/min via INTRAVENOUS
  Administered 2013-12-05: 1.5 mg/min via INTRAVENOUS
  Filled 2013-12-05: qty 100

## 2013-12-05 MED ORDER — INFLUENZA VAC SPLIT QUAD 0.5 ML IM SUSP
0.5000 mL | INTRAMUSCULAR | Status: AC
  Administered 2013-12-06: 0.5 mL via INTRAMUSCULAR
  Filled 2013-12-05: qty 0.5

## 2013-12-05 MED ORDER — SODIUM CHLORIDE 0.9 % IV SOLN
250.0000 mL | INTRAVENOUS | Status: DC | PRN
  Administered 2013-12-05: 10 mL/h via INTRAVENOUS

## 2013-12-05 MED ORDER — SODIUM CHLORIDE 0.9 % IJ SOLN
3.0000 mL | INTRAMUSCULAR | Status: DC | PRN
  Administered 2013-12-06: 3 mL via INTRAVENOUS

## 2013-12-05 MED ORDER — ALPRAZOLAM 0.25 MG PO TABS: 0.2500 mg | ORAL_TABLET | Freq: Two times a day (BID) | ORAL | Status: DC | PRN

## 2013-12-05 MED ORDER — FUROSEMIDE 10 MG/ML IJ SOLN
80.0000 mg | Freq: Two times a day (BID) | INTRAMUSCULAR | Status: DC
  Administered 2013-12-05 – 2013-12-09 (×8): 80 mg via INTRAVENOUS
  Filled 2013-12-05 (×12): qty 8

## 2013-12-05 MED ORDER — SODIUM CHLORIDE 0.9 % IJ SOLN
3.0000 mL | Freq: Two times a day (BID) | INTRAMUSCULAR | Status: DC
  Administered 2013-12-05 – 2013-12-10 (×9): 3 mL via INTRAVENOUS

## 2013-12-05 MED ORDER — FUROSEMIDE 10 MG/ML IJ SOLN
60.0000 mg | Freq: Once | INTRAMUSCULAR | Status: AC
  Administered 2013-12-05: 60 mg via INTRAVENOUS
  Filled 2013-12-05: qty 6

## 2013-12-05 MED ORDER — POTASSIUM CHLORIDE CRYS ER 20 MEQ PO TBCR
40.0000 meq | EXTENDED_RELEASE_TABLET | Freq: Once | ORAL | Status: AC
  Administered 2013-12-05: 40 meq via ORAL
  Filled 2013-12-05: qty 2

## 2013-12-05 MED ORDER — POTASSIUM CHLORIDE CRYS ER 20 MEQ PO TBCR
40.0000 meq | EXTENDED_RELEASE_TABLET | Freq: Two times a day (BID) | ORAL | Status: DC
  Administered 2013-12-05 – 2013-12-06 (×3): 40 meq via ORAL
  Filled 2013-12-05 (×5): qty 2

## 2013-12-05 MED ORDER — HEPARIN SODIUM (PORCINE) 5000 UNIT/ML IJ SOLN
5000.0000 [IU] | Freq: Three times a day (TID) | INTRAMUSCULAR | Status: DC
  Administered 2013-12-05 – 2013-12-10 (×13): 5000 [IU] via SUBCUTANEOUS
  Filled 2013-12-05 (×17): qty 1

## 2013-12-05 NOTE — ED Provider Notes (Addendum)
CSN: [id number]     Arrival date & time 12/05/13  1209 History   First MD Initiated Contact with Patient 12/05/13 1318     Chief Complaint  Patient presents with  . Shortness of Breath  . Leg Swelling   (Consider location/radiation/quality/duration/timing/severity/associated sxs/prior Treatment) HPI  37 year old male with dyspnea. Progressively worsening over the past 2-3 weeks. Increasing edema over the past 2-3 weeks as well. Patient feels very short of breath with any type of activity. Endorses orthopnea. No cough. Denies any acute pain. Urine output has remained stable. He's been told he has hypertension in the past. Previously started on amlodipine and hctz prescribed after ED visit in February, but ran out and hasn't taken in several months. She has not followed up since then and has no primary care provider. Denies drug use. Former smoker. No known history of hepatic dysfunction and denies significant alcohol use.   Past Medical History  Diagnosis Date  . Hypertension    History reviewed. No pertinent past surgical history. History reviewed. No pertinent family history. History  Substance Use Topics  . Smoking status: Former Smoker    Quit date: 03/23/2012  . Smokeless tobacco: Not on file  . Alcohol Use: No    Review of Systems  All systems reviewed and negative, other than as noted in HPI.   Allergies  Shellfish allergy  Home Medications   Current Outpatient Rx  Name  Route  Sig  Dispense  Refill  . Multiple Vitamin (MULTIVITAMIN WITH MINERALS) TABS tablet   Oral   Take 1 tablet by mouth daily.          BP 206/147  Pulse 105  Temp(Src) 98.7 F (37.1 C) (Oral)  Resp 18  Ht 6' (1.829 m)  Wt 364 lb 6.4 oz (165.291 kg)  BMI 49.41 kg/m2  SpO2 93% Physical Exam  Nursing note and vitals reviewed. Constitutional: He appears well-developed and well-nourished. No distress.  Sitting up in bed. NAD. Morbidly obese.   HENT:  Head: Normocephalic and  atraumatic.  Eyes: Conjunctivae are normal. Right eye exhibits no discharge. Left eye exhibits no discharge.  Neck: Neck supple.  Cardiovascular: Regular rhythm and normal heart sounds.  Exam reveals no gallop and no friction rub.   No murmur heard. Mild tachycardia  Pulmonary/Chest: Effort normal. No respiratory distress.  Decreased breath sounds bilaterally. Faint crackles/wheezing? At bases  Abdominal: Soft. He exhibits no distension. There is no tenderness.  Musculoskeletal: He exhibits edema. He exhibits no tenderness.  Tense pitting lower extremity edema as well as the lower abdomen  Neurological: He is alert.  Skin: Skin is warm and dry.  Psychiatric: He has a normal mood and affect. His behavior is normal. Thought content normal.    ED Course  Procedures (including critical care time)  CRITICAL CARE Performed by: Raeford Razor  Total critical care time: 35 minutes  Critical care time was exclusive of separately billable procedures and treating other patients. Critical care was necessary to treat or prevent imminent or life-threatening deterioration. Critical care was time spent personally by me on the following activities: development of treatment plan with patient and/or surrogate as well as nursing, discussions with consultants, evaluation of patient's response to treatment, examination of patient, obtaining history from patient or surrogate, ordering and performing treatments and interventions, ordering and review of laboratory studies, ordering and review of radiographic studies, pulse oximetry and re-evaluation of patient's condition.  Labs Review Labs Reviewed  PRO B NATRIURETIC PEPTIDE - Abnormal; Notable for  the following:    Pro B Natriuretic peptide (BNP) 2630.0 (*)    All other components within normal limits  COMPREHENSIVE METABOLIC PANEL - Abnormal; Notable for the following:    Creatinine, Ser 1.73 (*)    GFR calc non Af Amer 48 (*)    GFR calc Af Amer 56  (*)    All other components within normal limits  TROPONIN I - Abnormal; Notable for the following:    Troponin I 0.34 (*)    All other components within normal limits  POCT I-STAT TROPONIN I - Abnormal; Notable for the following:    Troponin i, poc 0.24 (*)    All other components within normal limits  CBC   Imaging Review Dg Chest 2 View  12/05/2013   CLINICAL DATA:  Shortness of breath.  EXAM: CHEST  2 VIEW  COMPARISON:  January 23, 2013.  FINDINGS: Stable mild cardiomegaly. No pleural effusion or pneumothorax is noted. Bony thorax is intact. Minimal right basilar opacity is noted concerning for subsegmental atelectasis or possibly pneumonia.  IMPRESSION: Minimal right basilar opacity is noted concerning for subsegmental atelectasis or pneumonia.   Electronically Signed   By: Roque Lias M.D.   On: 12/05/2013 13:03    EKG Interpretation    Date/Time:  Tuesday December 05 2013 12:17:02 EST Ventricular Rate:  96 PR Interval:  174 QRS Duration: 96 QT Interval:  384 QTC Calculation: 485 R Axis:   12 Text Interpretation:  Normal sinus rhythm Left atrial enlargement Nonspecific T wave abnormality Prolonged QT No significant change since last tracing 01/2013 Confirmed by Raziya Aveni  MD, Ashten Sarnowski ([id number]) on 12/05/2013 1:20:00 PM            MDM   1. Dyspnea   2. Acute systolic HF (heart failure)   3. Anasarca   4. Hypertension, poor control   5.  Elevated Troponin  37 year old male with progressive dyspnea and edema. Patient is markedly hypertensive. Suspect acute systolic heart failure. Patient has been previously told he had hypertension and sounds like he was on hydrochlorothiazide/amlodipine briefly. He is currently not taking it though and does not have routine medical care. Workup thus far significant for mildly elevated troponin. Patient denies any chest pain. Suspect this is more "demand ischemia" or related to renal impairment. Will send another troponin as iSTAT labs can be  unreliable, particularly when showing mild elevations. Renal dysfunction consistent with prior recent labs. Patient will be started on a nitroglycerin drip. Diuretics. He was given aspirin. Heparin was deferred. EKG is not acutely changed from prior. CXR with R base opacity. Clinically more likely atelectasis. No fever, cough or leukocytosis and plausible alternative explanation or his dyspnea. Given this elevated troponin, degree of edema and marked hypertension he will be admitted for further treatment and evaluation.  3:25 PM Repeat troponin with same mild elevation.  Pt with no new complaints. Will continue current management and discuss with cards.   Raeford Razor, MD 12/05/13 [id number]

## 2013-12-05 NOTE — ED Notes (Signed)
Cardiology at bedside.

## 2013-12-05 NOTE — ED Notes (Signed)
Pt c/o abd swelling and leg swelling x 1 week with SOB; pt sts not taking htn meds x 3 months

## 2013-12-05 NOTE — ED Notes (Signed)
Lab reports troponin of 0.34, Dr. Juleen China notified.

## 2013-12-05 NOTE — ED Notes (Signed)
Results of troponin shown to dr. Estell Harpin and the charge nurse

## 2013-12-05 NOTE — H&P (Signed)
History and Physical   Patient ID: Bradley Wilson MRN: [id number], DOB/AGE: [birth year]   Admit date: 12/05/2013 Date of Consult: 12/05/2013  Primary Physician: Pcp Not In System Primary Cardiologist: New  HPI: Bradley Wilson is a 37 y.o. African American male w/ no prior cardiac history, PMHx s/f HTN, morbid obesity and family history of premature CAD who presents to the ED today c/o swelling.   He reports a 3 day history of worsening LE edema, abdominal distention, weight gain (from 280 lbs at baseline, to over 300 lbs), DOE/SOB, PND and orthopnea. He has hypertension which had been treated in the past with several BP meds. Patient is unable to recall the exact meds. He has been unable to afford them. He was surprised that he started gaining weight instead of losing it as he started a cleanse diet 1 week in which he has increased his fluid intake significantly. He denies chest pain, palpitations or syncope. No EtOH or illicit drug use. Father with MI at 82. No family h/o SCD. He works as a Curator, married with 3 healthy children. Lives in [place], [place]. No fevers, chills, cough, abnormal bleeding. No issues with thyroid. He does snore and wakes in the middle of the night gasping for breath. No prior sleep studies.   His symptoms continued to progress and he presented to the ED for further evaluation.  In the ED, EKG shows no ischemic changes. Possible LAE, LAD. No LVH. Initial TnI elevated at 0.24. Pro BNP 2630. CXR- mild cardiomegaly, minimal R basilar opacity concerning for subsegmental atelectasis or PNA. BMET- BUN 21/Cr 1.73, GFR 56. CBC WNL. BP was markedly elevated at 206/147 on arrival. He received Lasix 80mg  IV x 1 with -3300 I/O thereafter.  Cardiology has been asked to admit for newly diagnosed CHF.  Problem List: Past Medical History  Diagnosis Date  . Hypertension     Past Surgical History  Procedure Laterality Date  . Finger surgery       Allergies:  Allergies  Allergen  Reactions  . Shellfish Allergy     Home Medications: Prior to Admission medications   Medication Sig Start Date End Date Taking? Authorizing Provider  Multiple Vitamin (MULTIVITAMIN WITH MINERALS) TABS tablet Take 1 tablet by mouth daily.   Yes Historical Provider, MD    Inpatient Medications:    (Not in a hospital admission)  Family History  Problem Relation Age of Onset  . Heart attack Father 4     History   Social History  . Marital Status: Married    Spouse Name: N/A    Number of Children: 3  . Years of Education: N/A   Occupational History  . Mechanic    Social History Main Topics  . Smoking status: Former Smoker    Quit date: 03/23/2012  . Smokeless tobacco: Not on file  . Alcohol Use: No  . Drug Use: No  . Sexual Activity: Not on file   Other Topics Concern  . Not on file   Social History Narrative   Lives in [place], [place].      Review of Systems: General: positive for weight gain, negative for chills, fever, night sweats or weight changes.  Cardiovascular:  Positive for dyspnea on exertion, edema, orthopnea, paroxysmal nocturnal dyspnea or shortness of breath, negative for chest pain, palpitations Dermatological: negative for rash Respiratory: negative for cough or wheezing Urologic: negative for hematuria Abdominal: positive for abdominal distention, negative for nausea, vomiting, diarrhea, bright red blood per rectum, melena, or  hematemesis Neurologic:  negative for visual changes, syncope, or dizziness All other systems reviewed and are otherwise negative except as noted above.  Physical Exam: Blood pressure 176/109, pulse 90, temperature 98.7 F (37.1 C), temperature source Oral, resp. rate 30, height 6' (1.829 m), weight 165.291 kg (364 lb 6.4 oz), SpO2 96.00%.    General: Well developed, well nourished, in no acute distress. Head: Normocephalic, atraumatic, sclera non-icteric, no xanthomas, nares are without discharge.  Neck: Negative for  carotid bruits. JVP to earlobes.  Lungs: Reduced R basilar breath sounds, distant general breath sounds, no appreciableauscultation without wheezes, rales, or rhonchi. Breathing is mildly labored. Heart: RRR with S1 S2. No murmurs, rubs, or gallops appreciated. Abdomen: Distended, non-tender, with normoactive bowel sounds. No hepatomegaly. No rebound/guarding. No obvious abdominal masses. Msk:  Strength and tone appears normal for age. Extremities: 2+ bilateral pitting pretibial edema, no clubbing or cyanosis.  Distal pedal pulses are 2+ and equal bilaterally. Neuro: Alert and oriented X 3. Moves all extremities spontaneously. Psych:  Responds to questions appropriately with a normal affect.  Labs: Recent Labs     12/05/13  1229  WBC  9.8  HGB  14.1  HCT  42.7  MCV  84.6  PLT  297   Recent Labs Lab 12/05/13 1229  NA 141  K 3.9  CL 100  CO2 28  BUN 21  CREATININE 1.73*  CALCIUM 8.9  PROT 7.3  BILITOT 0.6  ALKPHOS 56  ALT 37  AST 35  GLUCOSE 96   Recent Labs     12/05/13  1430  TROPONINI  0.34*   Radiology/Studies: Dg Chest 2 View  12/05/2013   CLINICAL DATA:  Shortness of breath.  EXAM: CHEST  2 VIEW  COMPARISON:  January 23, 2013.  FINDINGS: Stable mild cardiomegaly. No pleural effusion or pneumothorax is noted. Bony thorax is intact. Minimal right basilar opacity is noted concerning for subsegmental atelectasis or possibly pneumonia.  IMPRESSION: Minimal right basilar opacity is noted concerning for subsegmental atelectasis or pneumonia.   Electronically Signed   By: Roque Lias M.D.   On: 12/05/2013 13:03   EKG: NSR, 96 bpm, LAE, LAD, lateral IVCD, no ST/T changes  ASSESSMENT AND PLAN:   1. Hypertensive emergency 2. Newly diagnosed unspecified, acute heart failure 3. Uncontrolled hypertension with suspected hypertensive heart disease 4. + troponin  Suspect type 2 demand ischemic 5. Medical noncompliance due to affordability 6. CKD, stage II-III  7. OSA  -sleep-disordered breathing and daytime somnolence 8. Morbid obesity  [name] presents to the ED after a 3 days history of worsening LE edema, abdominal distention, DOE/SOB, PND, orthopnea and weight gain. He has a history of hypertension, and has been off his antihypertensives x 3 months due to affordability. He has recently undertaken a cleanse-type diet consisting of significant fluid intake. Objectively, BP is markedly elevated (200s/140s). BNP is 2600. There is a mild TnI elevation. CXR indicates cardiomegaly. EKG with LAD, LAE, IVCD, no ischemic changes. Interestingly, no LVH. the patient has newly recognized decompensated heart failure due to uncontrolled hypertension and resultant hypertensive heart disease.     Will plan to admit for IV diuresis, continue NTG gtt to aide with preload/afterload, add labetalol IV for BP, transition to carvedilol and start ACEi tomorrow  Check 2D echo to assess for EF and WMAs to r/o ischemic etiology. Daily weights, strict I/Os, Na restriction, TED hose. Continue to cycle cardiac biomarkers. Hold on heparin for now. Consider starting if TnI elevates markedly. Suspect +  troponin  may be type II due to uncontrolled BP-- no chest pain or ischemic changes. Risk stratify with Hgb A1C, lipid panel. Check TSH. Sleep study as outpatient. Weight loss stressed.     Signed, R. Hurman Horn, PA-C 12/05/2013, 5:47 PM   The patient presents with decompensated heart failure uncontrolled hypertension renal disease and interestingly low-voltage on his ECG. He is markedly volume overloaded both in the right side and the left side although I cannot assess his neck veins.  I suspect, unfortunately, that he will have HFrEF as opposed to HFpEF. We'll begin him on beta blockers tonight as well as aggressive IV diuresis. I done titrated his nitroglycerin secondary to headache. In the event that he has HFrEF, hydralazine nitrates would be appropriate therapy with the addition of  ACE is potentially delayed depending on renal function and blood pressure.  He also has obstructive sleep apnea and will need an outpatient sleep study.  He is put on about 100 pounds in the last 20 years. To be important for him to be able to lose this.

## 2013-12-06 DIAGNOSIS — N179 Acute kidney failure, unspecified: Secondary | ICD-10-CM

## 2013-12-06 DIAGNOSIS — I10 Essential (primary) hypertension: Secondary | ICD-10-CM

## 2013-12-06 DIAGNOSIS — I517 Cardiomegaly: Secondary | ICD-10-CM

## 2013-12-06 DIAGNOSIS — I161 Hypertensive emergency: Secondary | ICD-10-CM

## 2013-12-06 DIAGNOSIS — I5021 Acute systolic (congestive) heart failure: Secondary | ICD-10-CM

## 2013-12-06 LAB — BASIC METABOLIC PANEL
BUN: 21 mg/dL (ref 6–23)
Calcium: 8.5 mg/dL (ref 8.4–10.5)
Calcium: 8.4 mg/dL (ref 8.4–10.5)
Chloride: 101 mEq/L (ref 96–112)
Chloride: 102 mEq/L (ref 96–112)
Creatinine, Ser: 1.6 mg/dL — ABNORMAL HIGH (ref 0.50–1.35)
GFR calc Af Amer: 60 mL/min — ABNORMAL LOW (ref >90)
GFR calc Af Amer: 61 mL/min — ABNORMAL LOW (ref >90)
Glucose, Bld: 94 mg/dL (ref 70–99)
Potassium: 4 mEq/L (ref 3.7–5.3)
Sodium: 143 mEq/L (ref 137–147)

## 2013-12-06 LAB — MRSA PCR SCREENING: MRSA by PCR: NEGATIVE

## 2013-12-06 LAB — GLUCOSE, CAPILLARY: Glucose-Capillary: 143 mg/dL — ABNORMAL HIGH (ref 70–99)

## 2013-12-06 LAB — TROPONIN I: Troponin I: <0.3 ng/mL (ref <0.30)

## 2013-12-06 LAB — TSH: TSH: 2.148 u[IU]/mL (ref 0.350–4.500)

## 2013-12-06 MED ORDER — PERFLUTREN LIPID MICROSPHERE
INTRAVENOUS | Status: AC
  Filled 2013-12-06: qty 10

## 2013-12-06 MED ORDER — HYDRALAZINE HCL 50 MG PO TABS
50.0000 mg | ORAL_TABLET | Freq: Three times a day (TID) | ORAL | Status: DC
  Administered 2013-12-06 – 2013-12-08 (×7): 50 mg via ORAL
  Filled 2013-12-06 (×10): qty 1

## 2013-12-06 MED ORDER — CARVEDILOL 6.25 MG PO TABS
6.2500 mg | ORAL_TABLET | Freq: Two times a day (BID) | ORAL | Status: DC
  Administered 2013-12-06 – 2013-12-07 (×4): 6.25 mg via ORAL
  Filled 2013-12-06 (×8): qty 1

## 2013-12-06 MED ORDER — ISOSORBIDE MONONITRATE ER 30 MG PO TB24
30.0000 mg | ORAL_TABLET | Freq: Every day | ORAL | Status: DC
  Administered 2013-12-06 – 2013-12-07 (×2): 30 mg via ORAL
  Filled 2013-12-06 (×3): qty 1

## 2013-12-06 NOTE — Progress Notes (Signed)
  Echocardiogram 2D Echocardiogram has been performed.  Cathie Beams 12/06/2013, 11:47 AM

## 2013-12-06 NOTE — Progress Notes (Signed)
Patient Name: Bradley Wilson Date of Encounter: 12/06/2013   Principal Problem:   Acute CHF Active Problems:   Hypertensive emergency   OBESITY   Acute renal failure   HYPERTRIGLYCERIDEMIA    SUBJECTIVE  Breathing better.  BP better.  Still on IV ntg @ 30 mcg.  Creat up slightly.  -5.5 L overnight.  Wt initially recorded @ 364 yesterday afternon, 357 last night @ 21:17, and now 352.  Says he was in the 320's about a month ago.  CURRENT MEDS . aspirin EC  81 mg Oral Daily  . furosemide  80 mg Intravenous BID  . heparin  5,000 Units Subcutaneous Q8H  . influenza vac split quadrivalent PF  0.5 mL Intramuscular Tomorrow-1000  . lisinopril  10 mg Oral Daily  . potassium chloride  40 mEq Oral BID  . sodium chloride  3 mL Intravenous Q12H   OBJECTIVE  Filed Vitals:   12/06/13 0430 12/06/13 0500 12/06/13 0600 12/06/13 0700  BP: 178/101 152/112 161/98 154/95  Pulse: 68 65 66 69  Temp:      TempSrc:      Resp: 30 22 22 24   Height:      Weight:  352 lb 11.8 oz (160 kg)    SpO2: 97% 98% 96% 93%    Intake/Output Summary (Last 24 hours) at 12/06/13 0737 Last data filed at 12/06/13 0600  Gross per 24 hour  Intake 186.92 ml  Output   5720 ml  Net -5533.08 ml   Filed Weights   12/05/13 1230 12/05/13 2117 12/06/13 0500  Weight: 364 lb 6.4 oz (165.291 kg) 357 lb 12.9 oz (162.3 kg) 352 lb 11.8 oz (160 kg)   PHYSICAL EXAM  General: Pleasant, NAD. Neuro: Alert and oriented X 3. Moves all extremities spontaneously. Psych: Normal affect. HEENT:  Normal  Neck: Supple without bruits.  Difficult to assess jvp 2/2 girth. Lungs:  Resp regular and unlabored, diminished breath sounds left base. Heart: RRR + s3, no murmurs. Abdomen: Edematous, non-tender, slightly distended, BS + x 4. Extremities: No clubbing, cyanosis.  2+ bilat LEE edema - L>R. DP/PT/Radials 2+ and equal bilaterally.  Accessory Clinical Findings  CBC  Recent Labs  12/05/13 1229 12/05/13 2150  WBC 9.8 9.9    HGB 14.1 13.6  HCT 42.7 40.7  MCV 84.6 84.3  PLT 297 330   Basic Metabolic Panel  Recent Labs  12/05/13 1229 12/05/13 2150 12/06/13 0240 12/06/13 0435  NA 141 145 143 142  K 3.9 3.5* 4.0 4.0  CL 100 102 101 102  CO2 28 30 31 28   GLUCOSE 96 135* 101* 94  BUN 21 20 21 21   CREATININE 1.73* 1.57* 1.63* 1.60*  CALCIUM 8.9 9.0 8.5 8.4  MG  --  2.2  --   --    Liver Function Tests  Recent Labs  12/05/13 1229  AST 35  ALT 37  ALKPHOS 56  BILITOT 0.6  PROT 7.3  ALBUMIN 3.6   Cardiac Enzymes  Recent Labs  12/05/13 1430 12/05/13 2150 12/06/13 0240  TROPONINI 0.34* <0.30 <0.30   Thyroid Function Tests  Recent Labs  12/05/13 2150  TSH 2.148    TELE  Rsr, 2.19 second pause during sleep.  ECG  Rsr, 71, lat twi - no change from admission.  Radiology/Studies  Dg Chest 2 View  12/05/2013   CLINICAL DATA:  Shortness of breath.  EXAM: CHEST  2 VIEW  COMPARISON:  January 23, 2013.  FINDINGS: Stable mild cardiomegaly. No  pleural effusion or pneumothorax is noted. Bony thorax is intact. Minimal right basilar opacity is noted concerning for subsegmental atelectasis or possibly pneumonia.  IMPRESSION: Minimal right basilar opacity is noted concerning for subsegmental atelectasis or pneumonia.   Electronically Signed   By: Roque Lias M.D.   On: 12/05/2013 13:03   ASSESSMENT AND PLAN  1.  Acute CHF/Hypertensive emergency:  Pt presented with progressive weight gain over about a month in the setting of trying to lose weight initially with a high fluid intake cleanse followed by "pills."  He has also been off of antihypertensives for 3+ months.  BP is better overnight and his weight is down.  - 5.5 L so far.  Creat up slightly.  Cont aggressive IV diuresis.  Start hydralazine.  Wean IV ntg in favor of long acting nitrate. Will cont acei and follow creat closely.  Echo today to determine systolic fxn.  2. Elevated trop:  Initial trop elevated however subsequent troponins  have been nl.  If EF down on echo, would likely plan cath pending renal fxn improvement.  3.  OSA:  Needs outpt sleep eval.  4.  Acute renal failure:  Creat initially 1.73.  Don't know chronicity.  Follow with diuresis.    5.  Morbid obesity:  Says he's been trying to lose weight with fad diets.  Would benefit from seeing cardiac rehab.  6.  Noncompliance:  We discussed the importance of med/lifestyle compliance.  Signed, Nicolasa Ducking NP  Patient seen with NP, agree with the above note.  BP still high this morning.  He diuresed well yesterday but still volume overloaded.  Initial troponin mildly elevated, followups have been normal. Suspect cardiomyopathy, most likely hypertensive.  Creatinine stable with diuresis.  - Echo today.  - Stop NTG gtt.  Will start Imdur 30 mg daily, hydralazine 37.5 mg tid, Coreg 6.25 mg bid.  Continue current lisinopril as long as renal fxn tolerates.  - Still overloaded, continue current Lasix.  - If EF low, will likely need LHC at some point.    Marca Ancona 12/06/2013 8:13 AM

## 2013-12-06 NOTE — Care Management Note (Addendum)
    Page 1 of 2   12/08/2013     4:12:41 PM   CARE MANAGEMENT NOTE 12/08/2013  Patient:  Bradley, Wilson   Account Number:  [id number]  Date Initiated:  12/06/2013  Documentation initiated by:  Junius Creamer  Subjective/Objective Assessment:   adm w htn     Action/Plan:   lives w wife   Anticipated DC Date:     Anticipated DC Plan:        DC Planning Services  CM consult  [facility]      Choice offered to / List presented to:             Status of service:   Medicare Important Message given?   (If response is "NO", the following Medicare IM given date fields will be blank) Date Medicare IM given:   Date Additional Medicare IM given:    Discharge Disposition:  HOME/SELF CARE  Per UR Regulation:  Reviewed for med. necessity/level of care/duration of stay  If discussed at Long Length of Stay Meetings, dates discussed:    Comments:  12/08/2013 Social:  From home with wife and children. Hx/o MCD Potential Heart Cath scheduled for today 12/08/2013 CM has notified [facility], Frances Maywood [phone] for consult request. Disposition Plan: [facility] appt: ___________ Erskine Emery Card Eligibility: _________ Disposition:  Patient will need Med Match and Orange Card Appt.  pending d/c medication review Crystal Hutchinson RN, BSN, MSHL, CCM 12/08/2013  [phone])  12/31 0934 debbie dowell rn,bsn spoke w pt. no ins or pcp. he's agreeable to getting appt w [facility]. will email them so they can set pt up w appt for orange card elidg and appt to see md.

## 2013-12-07 LAB — HIV ANTIBODY (ROUTINE TESTING W REFLEX): HIV: NONREACTIVE

## 2013-12-07 LAB — BASIC METABOLIC PANEL
BUN: 21 mg/dL (ref 6–23)
CO2: 26 mEq/L (ref 19–32)
Calcium: 8.5 mg/dL (ref 8.4–10.5)
Chloride: 101 mEq/L (ref 96–112)
Creatinine, Ser: 1.64 mg/dL — ABNORMAL HIGH (ref 0.50–1.35)
GFR calc Af Amer: 59 mL/min — ABNORMAL LOW (ref >90)
GFR calc non Af Amer: 51 mL/min — ABNORMAL LOW (ref >90)
Glucose, Bld: 85 mg/dL (ref 70–99)
Potassium: 3.9 mEq/L (ref 3.7–5.3)
Sodium: 142 mEq/L (ref 137–147)

## 2013-12-07 MED ORDER — SPIRONOLACTONE 12.5 MG HALF TABLET
12.5000 mg | ORAL_TABLET | Freq: Every day | ORAL | Status: DC
Start: 2013-12-07 — End: 2013-12-08
  Administered 2013-12-07 – 2013-12-08 (×2): 12.5 mg via ORAL
  Filled 2013-12-07 (×2): qty 1

## 2013-12-07 MED ORDER — POTASSIUM CHLORIDE CRYS ER 20 MEQ PO TBCR
20.0000 meq | EXTENDED_RELEASE_TABLET | Freq: Two times a day (BID) | ORAL | Status: DC
  Administered 2013-12-07 – 2013-12-10 (×7): 20 meq via ORAL
  Filled 2013-12-07 (×6): qty 1

## 2013-12-07 NOTE — Progress Notes (Signed)
Pt transferred to 4E16 from 2H.  Pt has been oriented to unit and to room.  Vitals taken.  BP 132/74, Pulse 88, R 20, and 02 sat 96% on RA.  Pt placed on telemetry and currently NSR.  Information has been verified with CCMD.  Pt resting comfortably in bed and appears in no acute distress.  Nurse reviewing transfer orders at this time. Nino Glow RN

## 2013-12-07 NOTE — Progress Notes (Signed)
Patient ID: Bradley Wilson, male   DOB: 1976-09-27, 38 y.o.   MRN: [id number]   SUBJECTIVE: Breathing better.  Diuresed well yesterday.  Echo showed EF 25% with global hypokinesis.  BP improving.   Marland Kitchen aspirin EC  81 mg Oral Daily  . carvedilol  6.25 mg Oral BID WC  . furosemide  80 mg Intravenous BID  . heparin  5,000 Units Subcutaneous Q8H  . hydrALAZINE  50 mg Oral Q8H  . isosorbide mononitrate  30 mg Oral Daily  . lisinopril  10 mg Oral Daily  . potassium chloride  20 mEq Oral BID  . sodium chloride  3 mL Intravenous Q12H  . spironolactone  12.5 mg Oral Daily     Filed Vitals:   12/06/13 2000 12/06/13 2332 12/07/13 0425 12/07/13 0429  BP: 141/72  147/93   Pulse:      Temp: 99 F (37.2 C) 99.1 F (37.3 C) 98.8 F (37.1 C)   TempSrc: Oral Oral Oral   Resp:      Height:      Weight:    157.9 kg (348 lb 1.7 oz)  SpO2: 91%  93%     Intake/Output Summary (Last 24 hours) at 12/07/13 0658 Last data filed at 12/06/13 2200  Gross per 24 hour  Intake   1207 ml  Output   4850 ml  Net  -3643 ml    LABS: Basic Metabolic Panel:  Recent Labs  12/45/80 1229 12/05/13 2150 12/06/13 0240 12/06/13 0435  NA 141 145 143 142  K 3.9 3.5* 4.0 4.0  CL 100 102 101 102  CO2 28 30 31 28   GLUCOSE 96 135* 101* 94  BUN 21 20 21 21   CREATININE 1.73* 1.57* 1.63* 1.60*  CALCIUM 8.9 9.0 8.5 8.4  MG  --  2.2  --   --    Liver Function Tests:  Recent Labs  12/05/13 1229  AST 35  ALT 37  ALKPHOS 56  BILITOT 0.6  PROT 7.3  ALBUMIN 3.6   No results found for this basename: LIPASE, AMYLASE,  in the last 72 hours CBC:  Recent Labs  12/05/13 1229 12/05/13 2150  WBC 9.8 9.9  HGB 14.1 13.6  HCT 42.7 40.7  MCV 84.6 84.3  PLT 297 330   Cardiac Enzymes:  Recent Labs  12/05/13 1430 12/05/13 2150 12/06/13 0240  TROPONINI 0.34* <0.30 <0.30   BNP: No components found with this basename: POCBNP,  D-Dimer: No results found for this basename: DDIMER,  in the last 72  hours Hemoglobin A1C: No results found for this basename: HGBA1C,  in the last 72 hours Fasting Lipid Panel: No results found for this basename: CHOL, HDL, LDLCALC, TRIG, CHOLHDL, LDLDIRECT,  in the last 72 hours Thyroid Function Tests:  Recent Labs  12/05/13 2150  TSH 2.148   Anemia Panel: No results found for this basename: VITAMINB12, FOLATE, FERRITIN, TIBC, IRON, RETICCTPCT,  in the last 72 hours  RADIOLOGY: Dg Chest 2 View  12/05/2013   CLINICAL DATA:  Shortness of breath.  EXAM: CHEST  2 VIEW  COMPARISON:  January 23, 2013.  FINDINGS: Stable mild cardiomegaly. No pleural effusion or pneumothorax is noted. Bony thorax is intact. Minimal right basilar opacity is noted concerning for subsegmental atelectasis or possibly pneumonia.  IMPRESSION: Minimal right basilar opacity is noted concerning for subsegmental atelectasis or pneumonia.   Electronically Signed   By: Roque Lias M.D.   On: 12/05/2013 13:03    PHYSICAL EXAM General: NAD  Neck: JVP 12 cm, no thyromegaly or thyroid nodule.  Lungs: Clear to auscultation bilaterally with normal respiratory effort. CV: Nondisplaced PMI.  Heart regular S1/S2, no S3/S4, no murmur.  1+ edema 1/2 up lower legs bilaterally.   Abdomen: Soft, nontender, no hepatosplenomegaly, no distention.  Neurologic: Alert and oriented x 3.  Psych: Normal affect. Extremities: No clubbing or cyanosis.   TELEMETRY: Reviewed telemetry pt in NSR  ASSESSMENT AND PLAN: 38 yo with history of HTN presented with hypertensive emergency and pulmonary edema.  1. HTN: BP under better control with meds.  He has not had any antihypertensives for the last 3 months.  2. Acute systolic CHF: In setting of hypertensive emergency.  Echo showed EF 25% with global HK and moderate LVH.   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  No ETOH or drugs.  No chest pain.  However, his father did have an MI at 9842.  He diuresed well yesterday and weight is coming down.  He is still volume  overloaded.  - Continue current Coreg, lisinopril, hydralazine/Imdur. - Add spironolactone and cut back KCl to 20 bid.  - Continue current Lasix dosing today.  - Send HIV and SPEP/UPEP - Will need ischemia workup eventually given cardiomyopathy.  ?LHC tomorrow if renal fxn stable. 3. CKD: Stable creatinine.  Suspect this is primarily related to HTN.   4. Suspect OSA: Needs outpatient sleep study.   Marca AnconaDalton Brayleigh Rybacki 12/07/2013 7:05 AM

## 2013-12-07 NOTE — Progress Notes (Signed)
Pt had no significant events throughout the day.  Report given to receiving nightshift RN.  RN denies any questions or concerns at this time.  Pt resting comfortably in bed and appears in no acute distress. Nino Glow RN

## 2013-12-07 NOTE — Progress Notes (Signed)
Reinforced pt education regarding fluid intake, salt restriction, and daily weights.  Pt and pt spouse verbalize understanding.  Pt also educated on the importance of taking d/c meds as ordered by MD.  Discussed pending case manager consult to offer med assistance.   Nino Glow RN

## 2013-12-08 ENCOUNTER — Encounter (HOSPITAL_COMMUNITY): Payer: Self-pay | Admitting: Internal Medicine

## 2013-12-08 ENCOUNTER — Encounter (HOSPITAL_COMMUNITY)
Admission: EM | Disposition: A | Discharge status: Home / Self Care | Payer: Self-pay | Source: Home / Self Care | Attending: Internal Medicine

## 2013-12-08 DIAGNOSIS — R609 Edema, unspecified: Secondary | ICD-10-CM

## 2013-12-08 DIAGNOSIS — G4730 Sleep apnea, unspecified: Secondary | ICD-10-CM

## 2013-12-08 DIAGNOSIS — G4733 Obstructive sleep apnea (adult) (pediatric): Secondary | ICD-10-CM

## 2013-12-08 DIAGNOSIS — I428 Other cardiomyopathies: Secondary | ICD-10-CM

## 2013-12-08 DIAGNOSIS — I429 Cardiomyopathy, unspecified: Secondary | ICD-10-CM

## 2013-12-08 HISTORY — PX: LEFT AND RIGHT HEART CATHETERIZATION WITH CORONARY ANGIOGRAM: SHX5449

## 2013-12-08 LAB — CBC
HCT: 44.1 % (ref 39.0–52.0)
HEMOGLOBIN: 14.3 g/dL (ref 13.0–17.0)
MCH: 27.8 pg (ref 26.0–34.0)
MCHC: 32.4 g/dL (ref 30.0–36.0)
MCV: 85.8 fL (ref 78.0–100.0)
Platelets: 378 10*3/uL (ref 150–400)
RBC: 5.14 MIL/uL (ref 4.22–5.81)
RDW: 14 % (ref 11.5–15.5)
WBC: 9.5 10*3/uL (ref 4.0–10.5)

## 2013-12-08 LAB — BASIC METABOLIC PANEL
BUN: 20 mg/dL (ref 6–23)
CO2: 29 mEq/L (ref 19–32)
CREATININE: 1.61 mg/dL — AB (ref 0.50–1.35)
Calcium: 8.8 mg/dL (ref 8.4–10.5)
Chloride: 97 mEq/L (ref 96–112)
GFR calc Af Amer: 61 mL/min — ABNORMAL LOW (ref >90)
GFR, EST NON AFRICAN AMERICAN: 52 mL/min — AB (ref >90)
GLUCOSE: 95 mg/dL (ref 70–99)
Potassium: 4 mEq/L (ref 3.7–5.3)
Sodium: 141 mEq/L (ref 137–147)

## 2013-12-08 LAB — CREATININE, SERUM
Creatinine, Ser: 1.52 mg/dL — ABNORMAL HIGH (ref 0.50–1.35)
GFR calc Af Amer: 65 mL/min — ABNORMAL LOW (ref >90)
GFR, EST NON AFRICAN AMERICAN: 56 mL/min — AB (ref >90)

## 2013-12-08 LAB — HEMOGLOBIN A1C
Hgb A1c MFr Bld: 5.6 % (ref <5.7)
MEAN PLASMA GLUCOSE: 114 mg/dL (ref <117)

## 2013-12-08 LAB — PROTIME-INR
INR: 1.08 (ref 0.00–1.49)
Prothrombin Time: 13.8 seconds (ref 11.6–15.2)

## 2013-12-08 SURGERY — LEFT AND RIGHT HEART CATHETERIZATION WITH CORONARY ANGIOGRAM
Anesthesia: LOCAL

## 2013-12-08 MED ORDER — HEPARIN SODIUM (PORCINE) 1000 UNIT/ML IJ SOLN
INTRAMUSCULAR | Status: AC
  Filled 2013-12-08: qty 1

## 2013-12-08 MED ORDER — FUROSEMIDE 10 MG/ML IJ SOLN
INTRAMUSCULAR | Status: AC
  Filled 2013-12-08: qty 4

## 2013-12-08 MED ORDER — FUROSEMIDE 10 MG/ML IJ SOLN: 80.0000 mg | Freq: Once | INTRAMUSCULAR | Status: DC

## 2013-12-08 MED ORDER — VERAPAMIL HCL 2.5 MG/ML IV SOLN
INTRAVENOUS | Status: AC
  Filled 2013-12-08: qty 2

## 2013-12-08 MED ORDER — HYDRALAZINE HCL 50 MG PO TABS: 100.0000 mg | ORAL_TABLET | Freq: Three times a day (TID) | ORAL | Status: DC

## 2013-12-08 MED ORDER — ISOSORBIDE MONONITRATE ER 60 MG PO TB24
60.0000 mg | ORAL_TABLET | Freq: Every day | ORAL | Status: DC
Start: 2013-12-08 — End: 2013-12-10
  Administered 2013-12-08 – 2013-12-10 (×3): 60 mg via ORAL
  Filled 2013-12-08 (×3): qty 1

## 2013-12-08 MED ORDER — FENTANYL CITRATE 0.05 MG/ML IJ SOLN
INTRAMUSCULAR | Status: AC
  Filled 2013-12-08: qty 2

## 2013-12-08 MED ORDER — HEPARIN SODIUM (PORCINE) 5000 UNIT/ML IJ SOLN: 5000.0000 [IU] | Freq: Three times a day (TID) | INTRAMUSCULAR | Status: DC

## 2013-12-08 MED ORDER — CARVEDILOL 12.5 MG PO TABS
12.5000 mg | ORAL_TABLET | Freq: Two times a day (BID) | ORAL | Status: DC
  Administered 2013-12-08 – 2013-12-10 (×5): 12.5 mg via ORAL
  Filled 2013-12-08 (×7): qty 1

## 2013-12-08 MED ORDER — SPIRONOLACTONE 25 MG PO TABS
25.0000 mg | ORAL_TABLET | Freq: Every day | ORAL | Status: DC
  Administered 2013-12-09 – 2013-12-10 (×2): 25 mg via ORAL
  Filled 2013-12-08 (×2): qty 1

## 2013-12-08 MED ORDER — HEPARIN (PORCINE) IN NACL 2-0.9 UNIT/ML-% IJ SOLN
INTRAMUSCULAR | Status: AC
  Filled 2013-12-08: qty 1000

## 2013-12-08 MED ORDER — LIDOCAINE HCL (PF) 1 % IJ SOLN
INTRAMUSCULAR | Status: AC
  Filled 2013-12-08: qty 30

## 2013-12-08 MED ORDER — HYDRALAZINE HCL 20 MG/ML IJ SOLN
INTRAMUSCULAR | Status: AC
  Filled 2013-12-08: qty 1

## 2013-12-08 MED ORDER — ONDANSETRON HCL 4 MG/2ML IJ SOLN: 4.0000 mg | Freq: Four times a day (QID) | INTRAMUSCULAR | Status: DC | PRN

## 2013-12-08 MED ORDER — SODIUM CHLORIDE 0.9 % IV SOLN
1.0000 mL/kg/h | INTRAVENOUS | Status: DC
  Administered 2013-12-08: 10:00:00 1 mL/kg/h via INTRAVENOUS

## 2013-12-08 MED ORDER — HYDRALAZINE HCL 50 MG PO TABS: 75.0000 mg | ORAL_TABLET | Freq: Three times a day (TID) | ORAL | Status: DC

## 2013-12-08 MED ORDER — HYDRALAZINE HCL 50 MG PO TABS
50.0000 mg | ORAL_TABLET | Freq: Three times a day (TID) | ORAL | Status: DC
  Filled 2013-12-08 (×3): qty 1

## 2013-12-08 MED ORDER — DIAZEPAM 5 MG PO TABS
5.0000 mg | ORAL_TABLET | ORAL | Status: AC
  Administered 2013-12-08: 5 mg via ORAL
  Filled 2013-12-08: qty 1

## 2013-12-08 MED ORDER — MIDAZOLAM HCL 2 MG/2ML IJ SOLN
INTRAMUSCULAR | Status: AC
  Filled 2013-12-08: qty 2

## 2013-12-08 MED ORDER — HYDRALAZINE HCL 50 MG PO TABS
75.0000 mg | ORAL_TABLET | Freq: Three times a day (TID) | ORAL | Status: DC
  Administered 2013-12-08 – 2013-12-10 (×6): 75 mg via ORAL
  Filled 2013-12-08 (×10): qty 1

## 2013-12-08 MED ORDER — ACETAMINOPHEN 325 MG PO TABS: 650.0000 mg | ORAL_TABLET | ORAL | Status: DC | PRN

## 2013-12-08 NOTE — Progress Notes (Signed)
Pt sp R and L+ cath today, up on the chair, denies any pain or discomfort at this time. R+ radial level 0. Pt encouraged to keep right arm still and avoid movement on it to prevent any further complication or bleeding. We'll continue with POC,

## 2013-12-08 NOTE — Progress Notes (Signed)
Pt a/o, no c/o pain or SOB, pt going for heart cath today, groins prepped and skin intact, pt stable

## 2013-12-08 NOTE — H&P (View-Only) (Signed)
Patient Name: Bradley Wilson      SUBJECTIVE: admitted 12/30 with acute CHF EF turns out to be 25% Also with OSA and poorly controlled HTN and obesity   Less sob   Past Medical History  Diagnosis Date  . Hypertension   . Cardiomyopathy-cause unknown 12/08/2013  . Acute systolic heart failure 12/06/2013    Scheduled Meds:  Scheduled Meds: . aspirin EC  81 mg Oral Daily  . carvedilol  6.25 mg Oral BID WC  . furosemide  80 mg Intravenous BID  . heparin  5,000 Units Subcutaneous Q8H  . hydrALAZINE  50 mg Oral Q8H  . isosorbide mononitrate  30 mg Oral Daily  . lisinopril  10 mg Oral Daily  . potassium chloride  20 mEq Oral BID  . sodium chloride  3 mL Intravenous Q12H  . spironolactone  12.5 mg Oral Daily   Continuous Infusions:   PHYSICAL EXAM Filed Vitals:   12/07/13 1551 12/07/13 2123 12/07/13 2128 12/08/13 0601  BP: 132/76 150/76 150/76 163/75  Pulse: 87  87 83  Temp:   98.1 F (36.7 C) 98.6 F (37 C)  TempSrc:   Oral Oral  Resp:   20 20  Height:      Weight:    341 lb 14.4 oz (155.085 kg)  SpO2:   95% 97%  Well developed and nourished in no acute distress HENT normal Neck supple with JVP-flat Clear Regular rate and rhythm, no murmurs or gallops Abd-soft with active BS No Clubbing cyanosis   L>>R edema Skin-warm and dry A & Oriented  Grossly normal sensory and motor function   TELEMETRY: Reviewed telemetry pt in  nsr    Intake/Output Summary (Last 24 hours) at 12/08/13 0742 Last data filed at 12/08/13 0602  Gross per 24 hour  Intake   1083 ml  Output   3600 ml  Net  -2517 ml    LABS: Basic Metabolic Panel:  Recent Labs Lab 12/05/13 1229 12/05/13 2150 12/06/13 0240 12/06/13 0435 12/07/13 0405  NA 141 145 143 142 142  K 3.9 3.5* 4.0 4.0 3.9  CL 100 102 101 102 101  CO2 28 30 31 28 26   GLUCOSE 96 135* 101* 94 85  BUN 21 20 21 21 21   CREATININE 1.73* 1.57* 1.63* 1.60* 1.64*  CALCIUM 8.9 9.0 8.5 8.4 8.5  MG  --  2.2  --   --    --    Cardiac Enzymes:  Recent Labs  12/05/13 1430 12/05/13 2150 12/06/13 0240  TROPONINI 0.34* <0.30 <0.30   CBC:  Recent Labs Lab 12/05/13 1229 12/05/13 2150  WBC 9.8 9.9  HGB 14.1 13.6  HCT 42.7 40.7  MCV 84.6 84.3  PLT 297 330   PROTIME: No results found for this basename: LABPROT, INR,  in the last 72 hours Liver Function Tests:  Recent Labs  12/05/13 1229  AST 35  ALT 37  ALKPHOS 56  BILITOT 0.6  PROT 7.3  ALBUMIN 3.6   No results found for this basename: LIPASE, AMYLASE,  in the last 72 hours BNP: BNP (last 3 results)  Recent Labs  12/05/13 1229  PROBNP 2630.0*  Thyroid Function Tests:  Recent Labs  12/05/13 2150  TSH 2.148      ASSESSMENT AND PLAN:  Principal Problem:   Acute systolic heart failure Active Problems:   HYPERTRIGLYCERIDEMIA   OBESITY   Hypertensive emergency   Acute renal failure   Cardiomyopathy-cause unknown  Sleep apnea//sleep disordered breathing   CM workup >>HIV neg, SPEP pending Continue vigorous diuresis until CR bumps Bp still elevated will increase coreg and nitrates Will need outpt sleep study Will check HGb A1C Cath today Venous dopplers for asymmetric edema  ??need for pelvic CT Signed, Epifanio Labrador MD  12/08/2013   

## 2013-12-08 NOTE — Progress Notes (Signed)
Pt had heart cath today, pt arrived back on unit at 1500, right radial level 0 with TR band in place, 3 cc of air removed every hour per protocol, no complications, radial pulse palpable, color/sensation/motor intact, 2x2 dressing in place, vss, pt stable

## 2013-12-08 NOTE — Interval H&P Note (Signed)
Cath Lab Visit (complete for each Cath Lab visit)  Clinical Evaluation Leading to the Procedure:   ACS: no  Non-ACS:    Anginal Classification: CCS III  Anti-ischemic medical therapy: Minimal Therapy (1 class of medications)  Non-Invasive Test Results: Intermediate-risk stress test findings: cardiac mortality 1-3%/year  Prior CABG: No previous CABG      History and Physical Interval Note:  12/08/2013 1:35 PM  Bradley Wilson  has presented today for surgery, with the diagnosis of Chest pain  The various methods of treatment have been discussed with the patient and family. After consideration of risks, benefits and other options for treatment, the patient has consented to  Procedure(s): LEFT AND RIGHT HEART CATHETERIZATION WITH CORONARY ANGIOGRAM (N/A) as a surgical intervention .  The patient's history has been reviewed, patient examined, no change in status, stable for surgery.  I have reviewed the patient's chart and labs.  Questions were answered to the patient's satisfaction.     Dalton [employer]

## 2013-12-08 NOTE — Progress Notes (Signed)
Bilateral lower extremity venous duplex:  No evidence of DVT, superficial thrombosis, or Baker's Cyst.   

## 2013-12-08 NOTE — Progress Notes (Addendum)
Patient Name: Bradley Wilson      SUBJECTIVE: admitted 12/30 with acute CHF EF turns out to be 25% Also with OSA and poorly controlled HTN and obesity   Less sob   Past Medical History  Diagnosis Date  . Hypertension   . Cardiomyopathy-cause unknown 12/08/2013  . Acute systolic heart failure 12/06/2013    Scheduled Meds:  Scheduled Meds: . aspirin EC  81 mg Oral Daily  . carvedilol  6.25 mg Oral BID WC  . furosemide  80 mg Intravenous BID  . heparin  5,000 Units Subcutaneous Q8H  . hydrALAZINE  50 mg Oral Q8H  . isosorbide mononitrate  30 mg Oral Daily  . lisinopril  10 mg Oral Daily  . potassium chloride  20 mEq Oral BID  . sodium chloride  3 mL Intravenous Q12H  . spironolactone  12.5 mg Oral Daily   Continuous Infusions:   PHYSICAL EXAM Filed Vitals:   12/07/13 1551 12/07/13 2123 12/07/13 2128 12/08/13 0601  BP: 132/76 150/76 150/76 163/75  Pulse: 87  87 83  Temp:   98.1 F (36.7 C) 98.6 F (37 C)  TempSrc:   Oral Oral  Resp:   20 20  Height:      Weight:    341 lb 14.4 oz (155.085 kg)  SpO2:   95% 97%  Well developed and nourished in no acute distress HENT normal Neck supple with JVP-flat Clear Regular rate and rhythm, no murmurs or gallops Abd-soft with active BS No Clubbing cyanosis   L>>R edema Skin-warm and dry A & Oriented  Grossly normal sensory and motor function   TELEMETRY: Reviewed telemetry pt in  nsr    Intake/Output Summary (Last 24 hours) at 12/08/13 0742 Last data filed at 12/08/13 0602  Gross per 24 hour  Intake   1083 ml  Output   3600 ml  Net  -2517 ml    LABS: Basic Metabolic Panel:  Recent Labs Lab 12/05/13 1229 12/05/13 2150 12/06/13 0240 12/06/13 0435 12/07/13 0405  NA 141 145 143 142 142  K 3.9 3.5* 4.0 4.0 3.9  CL 100 102 101 102 101  CO2 28 30 31 28 26   GLUCOSE 96 135* 101* 94 85  BUN 21 20 21 21 21   CREATININE 1.73* 1.57* 1.63* 1.60* 1.64*  CALCIUM 8.9 9.0 8.5 8.4 8.5  MG  --  2.2  --   --    --    Cardiac Enzymes:  Recent Labs  12/05/13 1430 12/05/13 2150 12/06/13 0240  TROPONINI 0.34* <0.30 <0.30   CBC:  Recent Labs Lab 12/05/13 1229 12/05/13 2150  WBC 9.8 9.9  HGB 14.1 13.6  HCT 42.7 40.7  MCV 84.6 84.3  PLT 297 330   PROTIME: No results found for this basename: LABPROT, INR,  in the last 72 hours Liver Function Tests:  Recent Labs  12/05/13 1229  AST 35  ALT 37  ALKPHOS 56  BILITOT 0.6  PROT 7.3  ALBUMIN 3.6   No results found for this basename: LIPASE, AMYLASE,  in the last 72 hours BNP: BNP (last 3 results)  Recent Labs  12/05/13 1229  PROBNP 2630.0*  Thyroid Function Tests:  Recent Labs  12/05/13 2150  TSH 2.148      ASSESSMENT AND PLAN:  Principal Problem:   Acute systolic heart failure Active Problems:   HYPERTRIGLYCERIDEMIA   OBESITY   Hypertensive emergency   Acute renal failure   Cardiomyopathy-cause unknown  Sleep apnea//sleep disordered breathing   CM workup >>HIV neg, SPEP pending Continue vigorous diuresis until CR bumps Bp still elevated will increase coreg and nitrates Will need outpt sleep study Will check HGb A1C Cath today Venous dopplers for asymmetric edema  ??need for pelvic CT Signed, Sherryl MangesSteven Yeimy Brabant MD  12/08/2013

## 2013-12-08 NOTE — CV Procedure (Signed)
    Cardiac Catheterization Procedure Note  Name: Bradley Wilson MRN: [id number] DOB: 1976-05-08  Procedure: Left Heart Cath, Selective Coronary Angiography  Indication: Cardiomyopathy of uncertain etiology, assess for CAD given family history of premature CAD and mild TnI elevation.    Procedural Details: We were unable to get venous access in the right [facility] for right heart cath.  The right wrist was prepped, draped, and anesthetized with 1% lidocaine. Using the modified Seldinger technique, a 5 French sheath was introduced into the right radial artery. 3 mg of verapamil was administered through the sheath, weight-based unfractionated heparin was administered intravenously. Standard Judkins catheters were used for selective coronary angiography. No LV-gram due to CKD.  Catheter exchanges were performed over an exchange length guidewire. There were no immediate procedural complications. A TR band was used for radial hemostasis at the completion of the procedure.  The patient was transferred to the post catheterization recovery area for further monitoring.  Procedural Findings: Hemodynamics: AO 151/114 LV 159/31  Coronary angiography: Coronary dominance: right  Left mainstem: No significant CAD.   Left anterior descending (LAD): Large caliber, no significant disease.   Left circumflex (LCx): Large caliber, no significant disease.   Right coronary artery (RCA): Large caliber, no significant disease.   Left ventriculography: Not done (CKD).   70 cc contrast.   Final Conclusions:  No angiographic CAD.  Nonischemic cardiomyopathy.  LVEDP remains high, continue IV diuresis.   Marca Ancona 12/08/2013, 2:43 PM

## 2013-12-09 NOTE — Progress Notes (Signed)
   SUBJECTIVE:  No distress.  No SOB.   PHYSICAL EXAM Filed Vitals:   12/08/13 1700 12/08/13 2104 12/09/13 0533 12/09/13 0544  BP: 135/78 125/52 136/73 130/78  Pulse: 92 80  76  Temp:  98.1 F (36.7 C)  98.2 F (36.8 C)  TempSrc:  Oral  Oral  Resp:  18  18  Height:      Weight:    334 lb (151.501 kg)  SpO2:  97%  98%   General:  No distress Lungs:  Clear Heart:  RRR Abdomen:  Positive bowel sounds, no rebound no guarding Extremities:  Right arm cath sites OK.  Moderate lower extremity edema.   LABS:  Results for orders placed during the hospital encounter of 12/05/13 (from the past 24 hour(s))  PROTIME-INR     Status: None   Collection Time    12/08/13 10:40 AM      Result Value Range   Prothrombin Time 13.8  11.6 - 15.2 seconds   INR 1.08  0.00 - 1.49  CBC     Status: None   Collection Time    12/08/13  5:30 PM      Result Value Range   WBC 9.5  4.0 - 10.5 K/uL   RBC 5.14  4.22 - 5.81 MIL/uL   Hemoglobin 14.3  13.0 - 17.0 g/dL   HCT 02.1  11.7 - 35.6 %   MCV 85.8  78.0 - 100.0 fL   MCH 27.8  26.0 - 34.0 pg   MCHC 32.4  30.0 - 36.0 g/dL   RDW 70.1  41.0 - 30.1 %   Platelets 378  150 - 400 K/uL  CREATININE, SERUM     Status: Abnormal   Collection Time    12/08/13  5:30 PM      Result Value Range   Creatinine, Ser 1.52 (*) 0.50 - 1.35 mg/dL   GFR calc non Af Amer 56 (*) >90 mL/min   GFR calc Af Amer 65 (*) >90 mL/min    Intake/Output Summary (Last 24 hours) at 12/09/13 3143 Last data filed at 12/09/13 0535  Gross per 24 hour  Intake    698 ml  Output   6300 ml  Net  -5602 ml    ASSESSMENT AND PLAN:  CARDIOMYOPATHY:  No evidence of CAD.  High EDP.  Continue IV diuresis.    CKD:  Creat is stable.    HTN:  BP OK.    RULE OUT DVT:  No evidence of DVT.  There is an enlarged lymph node that will need follow up by primary MD.  ENLARGED LYMPH NODE:  See above.   Fayrene Fearing [facility] 12/09/2013 9:07 AM

## 2013-12-10 ENCOUNTER — Encounter (HOSPITAL_COMMUNITY): Payer: Self-pay | Admitting: Physician Assistant

## 2013-12-10 LAB — BASIC METABOLIC PANEL
BUN: 19 mg/dL (ref 6–23)
CO2: 31 mEq/L (ref 19–32)
CREATININE: 1.75 mg/dL — AB (ref 0.50–1.35)
Calcium: 8.9 mg/dL (ref 8.4–10.5)
Chloride: 99 mEq/L (ref 96–112)
GFR calc non Af Amer: 47 mL/min — ABNORMAL LOW (ref >90)
GFR, EST AFRICAN AMERICAN: 55 mL/min — AB (ref >90)
Glucose, Bld: 90 mg/dL (ref 70–99)
Potassium: 4.1 mEq/L (ref 3.7–5.3)
SODIUM: 140 meq/L (ref 137–147)

## 2013-12-10 MED ORDER — FUROSEMIDE 80 MG PO TABS: 80.0000 mg | ORAL_TABLET | Freq: Two times a day (BID) | ORAL | Status: DC

## 2013-12-10 MED ORDER — POTASSIUM CHLORIDE CRYS ER 20 MEQ PO TBCR: 20.0000 meq | EXTENDED_RELEASE_TABLET | Freq: Every day | ORAL | Status: DC

## 2013-12-10 MED ORDER — CARVEDILOL 12.5 MG PO TABS: 12.5000 mg | ORAL_TABLET | Freq: Two times a day (BID) | ORAL | Status: DC

## 2013-12-10 MED ORDER — HYDRALAZINE HCL 50 MG PO TABS: 75.0000 mg | ORAL_TABLET | Freq: Three times a day (TID) | ORAL | Status: DC

## 2013-12-10 MED ORDER — ISOSORBIDE MONONITRATE ER 60 MG PO TB24: 60.0000 mg | ORAL_TABLET | Freq: Every day | ORAL | Status: DC

## 2013-12-10 MED ORDER — FUROSEMIDE 80 MG PO TABS
80.0000 mg | ORAL_TABLET | Freq: Two times a day (BID) | ORAL | Status: DC
  Filled 2013-12-10 (×2): qty 1

## 2013-12-10 MED ORDER — SPIRONOLACTONE 25 MG PO TABS: 25.0000 mg | ORAL_TABLET | Freq: Every day | ORAL | Status: DC

## 2013-12-10 MED ORDER — LISINOPRIL 10 MG PO TABS: 10.0000 mg | ORAL_TABLET | Freq: Every day | ORAL | Status: DC

## 2013-12-10 NOTE — Progress Notes (Signed)
   CARE MANAGEMENT NOTE 12/10/2013  Patient:  DWIJ, FOSHEE   Account Number:  [id number]  Date Initiated:  12/06/2013  Documentation initiated by:  Junius Creamer  Subjective/Objective Assessment:   adm w htn     Action/Plan:   lives w wife   Anticipated DC Date:  12/10/2013   Anticipated DC Plan:  HOME/SELF CARE      DC Planning Services  CM consult  [facility]      Choice offered to / List presented to:             Status of service:  Completed, signed off Medicare Important Message given?   (If response is "NO", the following Medicare IM given date fields will be blank) Date Medicare IM given:   Date Additional Medicare IM given:    Discharge Disposition:  HOME/SELF CARE  Per UR Regulation:  Reviewed for med. necessity/level of care/duration of stay  If discussed at Long Length of Stay Meetings, dates discussed:    Comments:  12/10/12 15:15 CM faxed request for follow up appt for pt who is to be discharged today.  [facility] agree[facility] to fill discharge prescripotions as pt fits criteria of no PCP, no INSURANCE, No money (even for [facility]).  Pt was given handout and instruction to call in the am for an appt.  Pt understands to tell [facility] he has prescriptions to fill and is very agreeable to pursue orange card and insurance.  no other CM needs were communicated.  Freddy Jaksch, BSN, [place] [phone].  12/08/2013 Social:  From home with wife and children. Hx/o MCD Potential Heart Cath scheduled for today 12/08/2013 CM has notified [facility], Frances Maywood [phone] for consult request. Disposition Plan: [facility] appt: ___________ Erskine Emery Card Eligibility: _________ Disposition:  Patient will need Med Match and Orange Card Appt.  pending d/c medication review Crystal Hutchinson RN, BSN, MSHL, CCM 12/08/2013  [phone])  12/31 0934 debbie dowell rn,bsn spoke w pt. no ins or pcp. he's agreeable to getting appt w [place] [facility]. will email them so they can set pt up w appt for orange card elidg and appt to see md.

## 2013-12-10 NOTE — Significant Event (Signed)
SATURATION QUALIFICATIONS: (This note is used to comply with regulatory documentation for home oxygen)  Patient Saturations on Room Air at Rest = 100%  Patient Saturations on Room Air while Ambulating = 93%  Patient Saturations on RA Liters of oxygen while Ambulating = NA%  Please briefly explain why patient needs home oxygen:NA

## 2013-12-10 NOTE — Discharge Summary (Signed)
Discharge Summary   Patient ID: Bradley Wilson MRN: [id number], DOB/AGE: 1976/02/26 38 y.o. Admit date: 12/05/2013 D/C date:     12/10/2013  Primary Care Provider: Pcp Not In System Primary Cardiologist: New to [facility] (CHF)  Primary Discharge Diagnoses:  1. 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 CKD stage III - UPEP with increased free kappa light chains, SPEP, Urine IFE pending 3. Accelerated HTN 4. Enlarged lymph node 5. Morbid obesity Body mass index is 44.85 kg/(m^2). 6. OSA  Hospital Course: Bradley Wilson is a 38 y/o M with history of HTN and no prior cardiac history who presented to [facility] 12/05/2013 with new onset CHF. He reported a 3 day history of worsening LE edema, abdominal distention, weight gain (from 280 lbs at baseline, to over 300 lbs), DOE/SOB, PND and orthopnea. In the past he has been treated for hypertension with medications but was unable to afford them. He was surprised that he started gaining weight instead of losing it as he started a cleanse diet 1 week prior to admission in which he has increased his fluid intake significantly. He denied CP, palpitations or syncope. EKG showed no ischemic change, Cr 1.73, pBNP 2600, and was markedly hypertensive at 206/147 on arrival. He was treated with IV Lasix in the ER with prompt -3300 UOP prior to cardiology evaluation. He was placed on scheduled IV Lasix. He was placed on IV NTG for blood pressure control which was eventually transitioned to Imdur. 2D echo showed EF 25% with global HK, grade 2 diastolic dysfunction, mild-mod dilated LA, mildly reduced RV systolic function. Troponin was minimally elevated initially then normalized. He spent several days in the hospital undergoing CHF med optimization and diuresis.   Cr remained stable but elevated in the realm of 1.5-1.7 so he likely has CKD stage III attributed to his HTN. LE  duplex negative for DVT. Cardiac cath was performed 12/08/13 showing no evidence of CKD. LVEDP remained high thus further diuresis was undertaken. He tolerated this procedure well. In total the patient has diuresed an impressive 34 lbs and -19.7 L. Further total body weight loss is imperative for his future health. Dr. Antoine Poche has seen and examined the patient today and feels he is stable for discharge. He was also seen by care management this admission for assistance.  Of note this admission SPEP was performed (normal except elevated free kappa light chains 3.97), UPEP remains in process, HIV negative. Per d/w Dr. Shirlee Latch - will get urine IFE prior to discharge and the patient will f/u in CHF clinic. I sent message to CHF team to help arrange this transition-of-care appointment with BMET. Per Dr. 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 The patient was instructed to f/u PCP for 1) enlarged lymph node on physical exam by Dr. Antoine Poche and 2) consideration of sleep study for sleep apnea.    Discharge Vitals: Blood pressure 123/84, pulse 72, temperature 98.1 F (36.7 C), temperature source Oral, resp. rate 18, height 6' (1.829 m), weight 330 lb 12.8 oz (150.05 kg), SpO2 93.00%.  Labs: Lab Results  Component Value Date   WBC 9.5 12/08/2013   HGB 14.3 12/08/2013   HCT 44.1 12/08/2013   MCV 85.8 12/08/2013   PLT 378 12/08/2013    Recent Labs Lab 12/05/13 1229  12/10/13 0450  NA 141  < >  140  K 3.9  < > 4.1  CL 100  < > 99  CO2 28  < > 31  BUN 21  < > 19  CREATININE 1.73*  < > 1.75*  CALCIUM 8.9  < > 8.9  PROT 7.3  --   --   BILITOT 0.6  --   --   ALKPHOS 56  --   --   ALT 37  --   --   AST 35  --   --   GLUCOSE 96  < > 90  < > = values in this interval not displayed.  Lab Results  Component Value Date   CHOL 248 08/28/2009   HDL 31 03/05/2009   LDLCALC 89 03/05/2009   TRIG 248 03/05/2009    Diagnostic Studies/Procedures   2D Echo 12/06/13 -  Left ventricle: The cavity size was normal. Wall thickness was increased in a pattern of moderate LVH. The estimated ejection fraction was 25%. Diffuse hypokinesis. Features are consistent with a pseudonormal left ventricular filling pattern, with concomitant abnormal relaxation and increased filling pressure (grade 2 diastolic dysfunction). E/medial e' > 15 suggesting LV end diastolic pressure at least 20 mmHg. - Aortic valve: There was no stenosis. Trivial regurgitation. - Left atrium: The atrium was mildly to moderately dilated. - Right ventricle: Poorly visualized. The cavity size was normal. Systolic function was mildly reduced. - Right atrium: The atrium was mildly dilated. - Tricuspid valve: Peak RV-RA gradient: 24mm Hg (S). - Systemic veins: The IVC was not visualized. - Pericardium, extracardiac: A trivial pericardial effusion was identified. Impressions: - Normal LV size with moderate LV hypertrophy. EF 25% with diffuse hypokinesis. Moderate diastolic dysfunction with evidence for elevated LV filling pressure. Normal RV size with mildly decreased systolic function. No significant valvular abnormalities.    Cardiac Cath 12/08/13 Procedure: Left Heart Cath, Selective Coronary Angiography  Indication: Cardiomyopathy of uncertain etiology, assess for CAD given family history of premature CAD and mild TnI elevation.  Procedural Details: [facility]. The right wrist was prepped, draped, and anesthetized with 1% lidocaine. Using the modified Seldinger technique, a 5 French sheath was introduced into the right radial artery. 3 mg of verapamil was administered through the sheath, weight-based unfractionated heparin was administered intravenously. Standard Judkins catheters were used for selective coronary angiography. No LV-gram due to CKD. Catheter exchanges were performed over an exchange length guidewire. There were no immediate  procedural complications. A TR band was used for radial hemostasis at the completion of the procedure. The patient was transferred to the post catheterization recovery area for further monitoring.  Procedural Findings:  Hemodynamics:  AO 151/114  LV 159/31  Coronary angiography:  Coronary dominance: right  Left mainstem: No significant CAD.  Left anterior descending (LAD): Large caliber, no significant disease.  Left circumflex (LCx): Large caliber, no significant disease.  Right coronary artery (RCA): Large caliber, no significant disease.  Left ventriculography: Not done (CKD).  70 cc contrast.  Final Conclusions: No angiographic CAD. Nonischemic cardiomyopathy. LVEDP remains high, continue IV diuresis.   Dg Chest 2 View  12/05/2013   CLINICAL DATA:  Shortness of breath.  EXAM: CHEST  2 VIEW  COMPARISON:  January 23, 2013.  FINDINGS: Stable mild cardiomegaly. No pleural effusion or pneumothorax is noted. Bony thorax is intact. Minimal right basilar opacity is noted concerning for subsegmental atelectasis or possibly pneumonia.  IMPRESSION: Minimal right basilar opacity is noted concerning  for subsegmental atelectasis or pneumonia.   Electronically Signed   By: Roque Lias M.D.   On: 12/05/2013 13:03    Discharge Medications     Medication List         carvedilol 12.5 MG tablet  Commonly known as:  COREG  Take 1 tablet (12.5 mg total) by mouth 2 (two) times daily with a meal.     furosemide 80 MG tablet  Commonly known as:  LASIX  Take 1 tablet (80 mg total) by mouth 2 (two) times daily.     hydrALAZINE 50 MG tablet  Commonly known as:  APRESOLINE  Take 1.5 tablets (75 mg total) by mouth every 8 (eight) hours.     isosorbide mononitrate 60 MG 24 hr tablet  Commonly known as:  IMDUR  Take 1 tablet (60 mg total) by mouth daily.     lisinopril 10 MG tablet  Commonly known as:  PRINIVIL,ZESTRIL  Take 1 tablet (10 mg total) by mouth daily.     multivitamin with minerals  Tabs tablet  Take 1 tablet by mouth daily.     potassium chloride SA 20 MEQ tablet  Commonly known as:  K-DUR,KLOR-CON  Take 1 tablet (20 mEq total) by mouth daily.     spironolactone 25 MG tablet  Commonly known as:  ALDACTONE  Take 1 tablet (25 mg total) by mouth daily.        Disposition   The patient will be discharged in stable condition to home. Discharge Orders   Future Orders Complete By Expires   Diet - low sodium heart healthy  As directed    Discharge instructions  As directed    Comments:     Please review the Congestive Heart Failure directions in this packet.  Dr. Antoine Poche recommends follow up with your primary care doctor for the following things: - for your enlarged lymph node - to discuss sleep study for sleep apnea  Total body weight loss is extremely important to your future health. We would recommend against fad diets and instead encourage you to make realistic lifestyle changes that you can stick with long term.   Increase activity slowly  As directed    Scheduling Instructions:     No driving for 2 days. No lifting over 5 lbs for 1 week. No sexual activity for 1 week. You may not return to work until cleared by your cardiologist. Keep procedure site clean & dry. If you notice increased pain, swelling, bleeding or pus, call/return!  You may shower, but no soaking baths/hot tubs/pools for 1 week.     Follow-up Information   Follow up with Marca Ancona, MD. (Our office will call you for a follow-up appointment to be seen within 1 week. Please call the office if you have not heard from [place] within 3 days.)    Specialty:  Cardiology   Contact information:   [address]  [address] [place] [address] [phone]         Duration of Discharge Encounter: Greater than 30 minutes including physician and PA time.  Signed, Ronie Spies PA-C 12/10/2013, 11:22 AM  Patient seen and examined.  Plan as discussed in my rounding note for today and outlined  above. Fayrene Fearing Ryker Pherigo  12/10/2013  12:12 PM

## 2013-12-10 NOTE — Progress Notes (Signed)
   SUBJECTIVE:  No distress.  No SOB.   PHYSICAL EXAM Filed Vitals:   12/09/13 0544 12/09/13 1430 12/09/13 2136 12/10/13 0448  BP: 130/78 134/76 114/63 123/84  Pulse: 76 78 72 72  Temp: 98.2 F (36.8 C) 98.3 F (36.8 C) 98.2 F (36.8 C) 98.1 F (36.7 C)  TempSrc: Oral Oral Oral Oral  Resp: 18 18 20 18   Height:      Weight: 334 lb (151.501 kg)   330 lb 12.8 oz (150.05 kg)  SpO2: 98% 94% 93% 93%   General:  No distress Lungs:  Clear Heart:  RRR Abdomen:  Positive bowel sounds, no rebound no guarding Extremities:  Right arm cath sites OK.  Moderate lower extremity edema improved.   LABS:  Results for orders placed during the hospital encounter of 12/05/13 (from the past 24 hour(s))  BASIC METABOLIC PANEL     Status: Abnormal   Collection Time    12/10/13  4:50 AM      Result Value Range   Sodium 140  137 - 147 mEq/L   Potassium 4.1  3.7 - 5.3 mEq/L   Chloride 99  96 - 112 mEq/L   CO2 31  19 - 32 mEq/L   Glucose, Bld 90  70 - 99 mg/dL   BUN 19  6 - 23 mg/dL   Creatinine, Ser 2.09 (*) 0.50 - 1.35 mg/dL   Calcium 8.9  8.4 - 47.0 mg/dL   GFR calc non Af Amer 47 (*) >90 mL/min   GFR calc Af Amer 55 (*) >90 mL/min    Intake/Output Summary (Last 24 hours) at 12/10/13 0909 Last data filed at 12/10/13 0459  Gross per 24 hour  Intake    560 ml  Output   3425 ml  Net  -2865 ml    ASSESSMENT AND PLAN:  CARDIOMYOPATHY:  No evidence of CAD.  High EDP.  OK to send home on oral diuresis.  He needs a scale at home.  I would use Lasix 80 PO bid.    Weight is down 34 lbs.   CKD:  Creat is up today.  Needs BMET and TOC appt for seven days.   HTN:  BP OK.    RULE OUT DVT:  No evidence of DVT.  There is an enlarged lymph node that will need follow up by primary MD.  ENLARGED LYMPH NODE:  See above.   Fayrene Fearing [facility] 12/10/2013 9:09 AM

## 2013-12-11 ENCOUNTER — Telehealth: Payer: Self-pay | Admitting: Licensed Clinical Social Worker

## 2013-12-11 LAB — UIFE/LIGHT CHAINS/TP QN, 24-HR UR
Albumin, U: DETECTED
Alpha 1, Urine: DETECTED — AB
Alpha 2, Urine: DETECTED — AB
BETA UR: DETECTED — AB
FREE KAPPA LT CHAINS, UR: 3.97 mg/dL — AB (ref 0.14–2.42)
FREE LAMBDA LT CHAINS, UR: 0.62 mg/dL (ref 0.02–0.67)
Free Kappa/Lambda Ratio: 6.4 ratio (ref 2.04–10.37)
Gamma Globulin, Urine: DETECTED — AB
Total Protein, Urine: 6.1 mg/dL

## 2013-12-11 LAB — PROTEIN ELECTROPHORESIS, SERUM
ALPHA-2-GLOBULIN: 10.9 % (ref 7.1–11.8)
Albumin ELP: 51.8 % — ABNORMAL LOW (ref 55.8–66.1)
Alpha-1-Globulin: 6 % — ABNORMAL HIGH (ref 2.9–4.9)
Beta 2: 6.1 % (ref 3.2–6.5)
Beta Globulin: 8.3 % — ABNORMAL HIGH (ref 4.7–7.2)
Gamma Globulin: 16.9 % (ref 11.1–18.8)
M-Spike, %: NOT DETECTED g/dL
Total Protein ELP: 6.8 g/dL (ref 6.0–8.3)

## 2013-12-11 NOTE — Telephone Encounter (Signed)
CSW rec'd request to assist patient with prescription assistance. Patient was discharged yesterday from [facility] and CM note states that [facility] filled [facility]. Patient is also to schedule appointment for follow up. Patient reports that [facility] [phone]) is unaware of his prescriptions this morning. CSW followed up and coordinated transfer of prescription which were inadvertently called into the [facility] [phone]). Patient informed that he can follow up with [facility] for prescription pick up. CSW available if further needs arise. Lasandra Beech, LCSW [phone]

## 2013-12-19 ENCOUNTER — Ambulatory Visit (HOSPITAL_COMMUNITY)
Admission: RE | Admit: 2013-12-19 | Discharge: 2013-12-19 | Disposition: A | Discharge status: Home / Self Care | Payer: Medicaid Other | Source: Ambulatory Visit | Attending: Internal Medicine | Admitting: Internal Medicine

## 2013-12-19 ENCOUNTER — Encounter (HOSPITAL_COMMUNITY): Payer: Self-pay | Admitting: Cardiology

## 2013-12-19 ENCOUNTER — Encounter: Payer: Self-pay | Admitting: Licensed Clinical Social Worker

## 2013-12-19 VITALS — BP 134/88 | HR 73 | Wt 330.5 lb

## 2013-12-19 DIAGNOSIS — I509 Heart failure, unspecified: Secondary | ICD-10-CM | POA: Insufficient documentation

## 2013-12-19 DIAGNOSIS — N183 Chronic kidney disease, stage 3 unspecified: Secondary | ICD-10-CM | POA: Insufficient documentation

## 2013-12-19 DIAGNOSIS — G4733 Obstructive sleep apnea (adult) (pediatric): Secondary | ICD-10-CM | POA: Insufficient documentation

## 2013-12-19 DIAGNOSIS — I10 Essential (primary) hypertension: Secondary | ICD-10-CM

## 2013-12-19 DIAGNOSIS — I5022 Chronic systolic (congestive) heart failure: Secondary | ICD-10-CM | POA: Insufficient documentation

## 2013-12-19 DIAGNOSIS — Z79899 Other long term (current) drug therapy: Secondary | ICD-10-CM | POA: Insufficient documentation

## 2013-12-19 DIAGNOSIS — E669 Obesity, unspecified: Secondary | ICD-10-CM

## 2013-12-19 DIAGNOSIS — G4730 Sleep apnea, unspecified: Secondary | ICD-10-CM

## 2013-12-19 DIAGNOSIS — I129 Hypertensive chronic kidney disease with stage 1 through stage 4 chronic kidney disease, or unspecified chronic kidney disease: Secondary | ICD-10-CM | POA: Insufficient documentation

## 2013-12-19 MED ORDER — CARVEDILOL 12.5 MG PO TABS: 18.7500 mg | ORAL_TABLET | Freq: Two times a day (BID) | ORAL | Status: DC

## 2013-12-19 NOTE — Patient Instructions (Signed)
Increase your coreg to 18.75 mg (1 1/2 tablets) in the morning and 18.75 mg (1 1/2 tablets) in the evening. Call if you start having any dizziness or fatigue.  Will set you up for a sleep study.  Weigh daily.  Call any issues [phone].  Do the following things EVERYDAY: 1) Weigh yourself in the morning before breakfast. Write it down and keep it in a log. 2) Take your medicines as prescribed 3) Eat low salt foods-Limit salt (sodium) to 2000 mg per day.  4) Stay as active as you can everyday 5) Limit all fluids for the day to less than 2 liters 6)

## 2013-12-19 NOTE — Progress Notes (Signed)
CSW met with patient and wife in HF Clinic. Patient was referred for assistance with insurance and resources. Patient reports he was admitted to [facility] from 12/30-12/10/13 with a new diagnosis of heart failure. Patient reports that he was previously diagnosed with HTN 3 years ago but has struggled with obtaining health insurance and often did not have his medications. He reports that he has been working temp jobs for the past few years and was hopeful to be hired permanently and receive benefits although was laid off on November 28, 2013. He states he is not eligible for unemployment due to temp job and recent hospitalization leaving him unable to work. Patient is married with 3 young children (ages 80,8 and 41). He reports he tried for insurance through the affordable care act but could not afford the $900 monthly premium. His wife works part time and not eligible for benefits although he reports that his children are receiving medicaid benefits. He has an appointment tomorrow 12/20/13 with medicaid at [facility] and will complete application. CSW discussed disability application, medicaid, and encouraged patient to contact CSW with outcome of medicaid appointment. Patient and wife appear to have basic understanding of diagnosis and need for medication compliance. Patient will contact CSW if any difficulties arise in obtaining medications. CSW will be available as needed.  Bradley Wilson, [place]

## 2013-12-19 NOTE — Progress Notes (Signed)
Patient ID: Bradley Wilson, male   DOB: 06/28/1976, 38 y.o.   MRN: [id number]  Cardiologist: Dr. Shirlee LatchMcLean  HPI: [name] is a ewis is a 38 y/o with history of HTN, CKD, morbid obesity, OSA and new onset systolic HF.  Admitted to the hospital 12/05/13 with SOB and weight gain. ECHO showed EF 25%, grade II DD and reduced RV systolic fx. LHC showed no CAD. SPEP was performed (normal except elevated free kappa light chains 3.97) and HIV negative. Discharge weight 330 lbs.   Post Hospital Follow up: Feeling well since going home. Denies SOB, orthopnea, PND or edema. + Snoring and wife reports gasping for air at night. Has not done much activity since being discharged. Able to walk from the car here with no issues. Not weighing at home he does not have a scale. Following low salt diet and restricting fluids to less than 2L.   ROS: All systems negative except as listed in HPI, PMH and Problem List.  Past Medical History  Diagnosis Date  . Hypertension   . Chronic systolic CHF (congestive heart failure)     a. Dx 11/2013 - likely due to hypertensive heart disease/NICM (EF 25%, normal cors).  . CKD (chronic kidney disease) stage 3, GFR 30-59 ml/min   . Enlarged lymph node   . Morbid obesity   . Sleep apnea     Current Outpatient Prescriptions  Medication Sig Dispense Refill  . carvedilol (COREG) 12.5 MG tablet Take 1 tablet (12.5 mg total) by mouth 2 (two) times daily with a meal.  60 tablet  6  . furosemide (LASIX) 80 MG tablet Take 1 tablet (80 mg total) by mouth 2 (two) times daily.  60 tablet  6  . hydrALAZINE (APRESOLINE) 50 MG tablet Take 1.5 tablets (75 mg total) by mouth every 8 (eight) hours.  135 tablet  6  . isosorbide mononitrate (IMDUR) 60 MG 24 hr tablet Take 1 tablet (60 mg total) by mouth daily.  30 tablet  6  . lisinopril (PRINIVIL,ZESTRIL) 10 MG tablet Take 1 tablet (10 mg total) by mouth daily.  30 tablet  6  . potassium chloride SA (K-DUR,KLOR-CON) 20 MEQ tablet Take 1 tablet (20  mEq total) by mouth daily.  30 tablet  6  . spironolactone (ALDACTONE) 25 MG tablet Take 1 tablet (25 mg total) by mouth daily.  30 tablet  6   No current facility-administered medications for this encounter.    Filed Vitals:   12/19/13 0844  BP: 134/88  Pulse: 73  Weight: 330 lb 8 oz (149.914 kg)  SpO2: 95%    PHYSICAL EXAM: General:  Well appearing. No resp difficulty; obese; wife present HEENT: normal Neck: supple. JVP difficult to assess d/t body habitus but does not appear elevated. Carotids 2+ bilaterally; no bruits. No lymphadenopathy or thryomegaly appreciated. Cor: PMI normal. Regular rate & rhythm. No rubs, gallops or murmurs. Lungs: clear Abdomen: soft, nontender, nondistended. No hepatosplenomegaly. No bruits or masses. Good bowel sounds. Extremities: no cyanosis, clubbing, rash, edema Neuro: alert & orientedx3, cranial nerves grossly intact. Moves all 4 extremities w/o difficulty. Affect pleasant.   ASSESSMENT & PLAN:  1) Chronic systolic HF: NICM, EF 25% (11/2013) - Patient is a newly diagnosed HF patient who was just recently discharged from the hospital. Reviewed hosptial notes. Currently NYHA II symptoms and volume status stable. Will continue lasix 80 mg BID and discussed the use of sliding scale diuretics.  - Will increase coreg 18.75 mg BID. Instructed to  call if he notices any fatigue or dizziness and can cut back to 12.5 mg q am and 18.75 mg q pm. - Continue Spiro 25 mg daily, lisinopril 10 mg daily, hydralazine 75 mg TID and IMDUR 60 mg daily.  - Will need repeat ECHO in 3 months to assess EF hoping will improve.  - Lengthy discussion with patient about taking medications daily and to call if he can't afford them or is going to run out. We discussed following a low salt diet and drinking less than 2 L a day. Provided a scale in order to track weights daily. - Will refer to SW to discuss applying for Medicaid. 2) HTN -Stable on current BB, ACE-I, Spiro and  hydralazine. As above will increase coreg for LV dysfunction.  3) Obesity - Discussed with patient about the need to try and exercise more and watch his portion control. 4) ?OSA - Wife reports snoring and patient gasping for air at night. Will refer to pulmonology for sleep evaluation. 5) CKD, stage III - baseline Cr 1.5-1.7. Recheck BMET next visit.   F/U 2-3 weeks for medication titration. Ulla Potash B NP-C 10:30 AM

## 2013-12-26 ENCOUNTER — Telehealth: Payer: Self-pay | Admitting: Licensed Clinical Social Worker

## 2013-12-26 NOTE — Telephone Encounter (Signed)
CSW contacted patient to follow up on medicaid appointment last week. Patient stated that he completed the application and submitted all necessary documentation required. Patient states that he is just waiting for a response now. CSW provided support and available if further needs arise. Lasandra Beech, LCSW [phone]

## 2014-01-02 ENCOUNTER — Inpatient Hospital Stay (HOSPITAL_COMMUNITY): Admission: RE | Admit: 2014-01-02 | Payer: Self-pay | Source: Ambulatory Visit

## 2014-01-05 ENCOUNTER — Ambulatory Visit: Payer: Self-pay

## 2014-01-11 ENCOUNTER — Ambulatory Visit: Payer: Self-pay | Admitting: Internal Medicine

## 2014-01-15 ENCOUNTER — Institutional Professional Consult (permissible substitution): Payer: Self-pay | Admitting: Pulmonary Disease

## 2014-01-18 ENCOUNTER — Ambulatory Visit (HOSPITAL_COMMUNITY)
Admission: RE | Admit: 2014-01-18 | Discharge: 2014-01-18 | Disposition: A | Discharge status: Home / Self Care | Payer: Medicaid Other | Source: Ambulatory Visit | Attending: Internal Medicine | Admitting: Internal Medicine

## 2014-01-18 ENCOUNTER — Telehealth (HOSPITAL_COMMUNITY): Payer: Self-pay

## 2014-01-18 ENCOUNTER — Encounter (HOSPITAL_COMMUNITY): Payer: Self-pay

## 2014-01-18 VITALS — BP 124/92 | HR 70 | Ht 72.0 in | Wt 337.1 lb

## 2014-01-18 DIAGNOSIS — N183 Chronic kidney disease, stage 3 unspecified: Secondary | ICD-10-CM | POA: Insufficient documentation

## 2014-01-18 DIAGNOSIS — I509 Heart failure, unspecified: Secondary | ICD-10-CM | POA: Insufficient documentation

## 2014-01-18 DIAGNOSIS — E669 Obesity, unspecified: Secondary | ICD-10-CM

## 2014-01-18 DIAGNOSIS — G4730 Sleep apnea, unspecified: Secondary | ICD-10-CM

## 2014-01-18 DIAGNOSIS — I5022 Chronic systolic (congestive) heart failure: Secondary | ICD-10-CM | POA: Insufficient documentation

## 2014-01-18 DIAGNOSIS — I129 Hypertensive chronic kidney disease with stage 1 through stage 4 chronic kidney disease, or unspecified chronic kidney disease: Secondary | ICD-10-CM | POA: Insufficient documentation

## 2014-01-18 DIAGNOSIS — I10 Essential (primary) hypertension: Secondary | ICD-10-CM

## 2014-01-18 LAB — BASIC METABOLIC PANEL
BUN: 17 mg/dL (ref 6–23)
CALCIUM: 9.9 mg/dL (ref 8.4–10.5)
CO2: 27 meq/L (ref 19–32)
Chloride: 100 mEq/L (ref 96–112)
Creatinine, Ser: 1.56 mg/dL — ABNORMAL HIGH (ref 0.50–1.35)
GFR calc Af Amer: 63 mL/min — ABNORMAL LOW (ref >90)
GFR calc non Af Amer: 54 mL/min — ABNORMAL LOW (ref >90)
Glucose, Bld: 100 mg/dL — ABNORMAL HIGH (ref 70–99)
POTASSIUM: 4.3 meq/L (ref 3.7–5.3)
SODIUM: 142 meq/L (ref 137–147)

## 2014-01-18 LAB — PRO B NATRIURETIC PEPTIDE: PRO B NATRI PEPTIDE: 142.6 pg/mL — AB (ref 0–125)

## 2014-01-18 MED ORDER — FUROSEMIDE 40 MG PO TABS: 80.0000 mg | ORAL_TABLET | Freq: Every day | ORAL | Status: DC

## 2014-01-18 MED ORDER — LISINOPRIL 10 MG PO TABS: 10.0000 mg | ORAL_TABLET | Freq: Two times a day (BID) | ORAL | Status: DC

## 2014-01-18 NOTE — Progress Notes (Signed)
Patient ID: Bradley Wilson, male   DOB: [birth year], 38 y.o.   MRN: [id number]  Cardiologist: Dr. Shirlee Latch  HPI: Mr. Popescu is a ewis is a 38 y/o with history of HTN, CKD, morbid obesity, OSA and new onset systolic HF.  Admitted to the hospital 12/05/13 with SOB and weight gain. ECHO showed EF 25%, grade II DD and reduced RV systolic fx. LHC showed no CAD. SPEP was performed (normal except elevated free kappa light chains 3.97) and HIV negative. Discharge weight 330 lbs.   Follow up: Last visit increased coreg to 18.75 mg BID, which he tolerated well. Denies SOB, orthopnea, CP or edema. + Snoring and wife reports gasping for air at night. Still not doing much activity because he was concerned he had to check with [place] first. Weight at home 330 lbs everyday. Following low salt diet and restricting fluids to less than 2L.   ROS: All systems negative except as listed in HPI, PMH and Problem List.  Past Medical History  Diagnosis Date  . Hypertension   . Chronic systolic CHF (congestive heart failure)     a. Dx 11/2013 - likely due to hypertensive heart disease/NICM (EF 25%, normal cors).  . CKD (chronic kidney disease) stage 3, GFR 30-59 ml/min   . Enlarged lymph node   . Morbid obesity   . Sleep apnea     Current Outpatient Prescriptions  Medication Sig Dispense Refill  . carvedilol (COREG) 12.5 MG tablet Take 1.5 tablets (18.75 mg total) by mouth 2 (two) times daily with a meal.  90 tablet  3  . furosemide (LASIX) 40 MG tablet Take 80 mg by mouth 2 (two) times daily.      . hydrALAZINE (APRESOLINE) 50 MG tablet Take 1.5 tablets (75 mg total) by mouth every 8 (eight) hours.  135 tablet  6  . isosorbide mononitrate (IMDUR) 60 MG 24 hr tablet Take 1 tablet (60 mg total) by mouth daily.  30 tablet  6  . lisinopril (PRINIVIL,ZESTRIL) 10 MG tablet Take 1 tablet (10 mg total) by mouth 2 (two) times daily.  60 tablet  3  . Multiple Vitamin (MULTI-VITAMIN DAILY PO) Take by mouth.      . potassium chloride  SA (K-DUR,KLOR-CON) 20 MEQ tablet Take 1 tablet (20 mEq total) by mouth daily.  30 tablet  6  . spironolactone (ALDACTONE) 25 MG tablet Take 1 tablet (25 mg total) by mouth daily.  30 tablet  6   No current facility-administered medications for this encounter.    Filed Vitals:   01/18/14 0922 01/18/14 0928  BP:  124/92  Pulse: 70   Height: 6' (1.829 m)   Weight: 337 lb 1.9 oz (152.917 kg)   SpO2: 100%     PHYSICAL EXAM: General:  Well appearing. No resp difficulty; obese;  HEENT: normal Neck: supple. JVP difficult to assess d/t body habitus but does not appear elevated. Carotids 2+ bilaterally; no bruits. No lymphadenopathy or thryomegaly appreciated. Cor: PMI normal. Regular rate & rhythm. No rubs, gallops or murmurs. Lungs: clear Abdomen: soft, nontender, nondistended. No hepatosplenomegaly. No bruits or masses. Good bowel sounds. Extremities: no cyanosis, clubbing, rash, edema Neuro: alert & orientedx3, cranial nerves grossly intact. Moves all 4 extremities w/o difficulty. Affect pleasant.   ASSESSMENT & PLAN:  1) Chronic systolic HF: NICM, EF 25% (11/2013) - NYHA II symptoms and volume status stable. Will continue lasix 80 mg BID, he is complaining of pruitus, may need to cut diuretics back. Will check BMET  and pro-BNP today. - Continue coreg 18.75 mg BID, spiro 25 mg daily, hydralazine 75 mg TID and IMDUR 60 mg daily.  - Will increase lisinopril to 10 mg BID, check BMET 7-10 days. - Will need repeat ECHO in 3 months to assess EF and whether he needs referral to EP. - Reinforced the need and importance of daily weights, a low sodium diet, and fluid restriction (less than 2 L a day). Instructed to call the HF clinic if weight increases more than 3 lbs overnight or 5 lbs in a week.  2) HTN -Stable on current BB, ACE-I, Spiro and hydralazine. As above will increase lisinopril for LV dysfunction.  3) Obesity - Discussed with patient about the need to try and exercise more and  watch his portion control. He reports he is going to try and get back in the gym and praised him for trying to increase activity. Discussed not lifting more than 35 lbs and to stop and rest if fatigued. 4) ?OSA - Has pulmonary appt set up later this month. 5) CKD, stage III - baseline Cr 1.5-1.7. Check BMET today.   F/U 3 weeks Ulla Potashosgrove, Finnley Beddow B NP-C 10:04 AM

## 2014-01-18 NOTE — Telephone Encounter (Signed)
Patient called and made aware of lab results, instructed to decrease lasix dosage to 80mg  once daily.  Patient agreeable and appreciative.  New Rx instructions and refill sent to preferred pharmacy electronically. [address]

## 2014-01-18 NOTE — Patient Instructions (Signed)
Increase your lisinopril to 10 mg (1 tablet) in the morning and 10 mg (1 tablet) in the evening.  Need to go to [address], [facility] next Thursday anytime for blood work. On the 3rd floor.  Increase your activity. Stop if you get SOB, dizzy or fatigued.  Call any issues [phone]  F/U in 1 month  Do the following things EVERYDAY: 1) Weigh yourself in the morning before breakfast. Write it down and keep it in a log. 2) Take your medicines as prescribed 3) Eat low salt foods-Limit salt (sodium) to 2000 mg per day.  4) Stay as active as you can everyday 5) Limit all fluids for the day to less than 2 liters 6)

## 2014-01-18 NOTE — Telephone Encounter (Signed)
Message copied by Chyrl Civatte on Thu Jan 18, 2014  4:30 PM ------      Message from: COSGROVE, ALI B      Created: Thu Jan 18, 2014  4:08 PM       Stable. Cut lasix back to 80 mg daily. Please call patient. ------

## 2014-01-24 ENCOUNTER — Encounter: Payer: Self-pay | Admitting: Pulmonary Disease

## 2014-01-24 ENCOUNTER — Ambulatory Visit (INDEPENDENT_AMBULATORY_CARE_PROVIDER_SITE_OTHER): Payer: Medicaid Other | Admitting: Pulmonary Disease

## 2014-01-24 ENCOUNTER — Encounter (INDEPENDENT_AMBULATORY_CARE_PROVIDER_SITE_OTHER): Payer: Self-pay

## 2014-01-24 VITALS — BP 128/92 | HR 74 | Temp 98.1°F | Ht 72.0 in | Wt 351.6 lb

## 2014-01-24 DIAGNOSIS — G4733 Obstructive sleep apnea (adult) (pediatric): Secondary | ICD-10-CM

## 2014-01-24 NOTE — Progress Notes (Signed)
Subjective:    Patient ID: Bradley Wilson, male    DOB: [birth year], 38 y.o.   MRN: [id number]  HPI The patient is a 38 year old male who I've been asked to see for possible obstructive sleep apnea. He has been noted to have loud snoring, as well as an abnormal breathing pattern during sleep. He will often awaken with his leg kicking and sitting up in bed for unknown reason. He has frequent awakenings at night, and at first it is rested in the mornings upon arising. Within one to 2 hours, he begins to have severe sleepiness during the day with any period of inactivity. He has even fallen asleep standing at work. He also falls asleep easily watching television or movies, and sleep pressure intermittently with driving. The patient states that his weight is up 80 pounds over the last 2 years, and his Epworth score today is 20.   Sleep Questionnaire What time do you typically go to bed?( Between what hours) 930-10 930-10 at 1011 on 01/24/14 by Maisie Fus, CMA How long does it take you to fall asleep? 2-3hours 2-3hours at 1011 on 01/24/14 by Maisie Fus, CMA How many times during the night do you wake up? 44 legs shaking per wife at 1011 on 01/24/14 by Maisie Fus, CMA What time do you get out of bed to start your day? 0530 0530 at 1011 on 01/24/14 by Maisie Fus, CMA Do you drive or operate heavy machinery in your occupation? Linwood Dibbles operates machine in factory at [employer] on 01/24/14 by Maisie Fus, CMA How much has your weight changed (up or down) over the past two years? (In pounds) 80 lb (36.288 kg) 80 lb (36.288 kg) at 1011 on 01/24/14 by Maisie Fus, CMA Have you ever had a sleep study before? No No at 1011 on 01/24/14 by Maisie Fus, CMA Do you currently use CPAP? No No at 1011 on 01/24/14 by Maisie Fus, CMA Do you wear oxygen at any time? No No at 1011 on 01/24/14 by Maisie Fus, CMA   Review of Systems  Constitutional: Positive for unexpected weight  change. Negative for fever.  HENT: Positive for congestion and sneezing. Negative for dental problem, ear pain, nosebleeds, postnasal drip, rhinorrhea, sinus pressure, sore throat and trouble swallowing.   Eyes: Negative for redness and itching.  Respiratory: Positive for cough and shortness of breath. Negative for chest tightness and wheezing.   Cardiovascular: Positive for leg swelling. Negative for palpitations.       Pt dx with CHF  Gastrointestinal: Negative for nausea and vomiting.       Acid heartburn  Genitourinary: Negative for dysuria.  Musculoskeletal: Positive for joint swelling.  Skin: Rash: itching.  Neurological: Negative for headaches.  Hematological: Does not bruise/bleed easily.  Psychiatric/Behavioral: Negative for dysphoric mood. The patient is not nervous/anxious.        Objective:   Physical Exam Constitutional:  Morbidly obese male, no acute distress  HENT:  Nares patent without discharge, septal deviation to the left with narrowing.   Oropharynx without exudate, palate and uvula are thick and elongated, +side wall narrowing.  Eyes:  Perrla, eomi, no scleral icterus  Neck:  No JVD, no TMG  Cardiovascular:  Normal rate, regular rhythm, no rubs or gallops.  No murmurs        Intact distal pulses  Pulmonary :  Normal breath sounds, no stridor or respiratory distress   No rales, rhonchi, or wheezing  Abdominal:  Soft, nondistended, bowel sounds present.  No tenderness noted.   Musculoskeletal: mild lower extremity edema noted.  Lymph Nodes:  No cervical lymphadenopathy noted  Skin:  No cyanosis noted  Neurologic: appears mildly sleepy, but appropriate, moves all 4 extremities without obvious deficit.         Assessment & Plan:

## 2014-01-24 NOTE — Patient Instructions (Signed)
Will schedule for home sleep study, and will arrange for followup once results are available.  Work on weight loss

## 2014-01-24 NOTE — Assessment & Plan Note (Signed)
The patient's history is most consistent with significant sleep disordered breathing. It had a long discussion with him about the pathophysiology of sleep apnea, including its impact to his quality of life and cardiovascular health. At this point, he needs to have a sleep study done, and I think he is an excellent candidate for home sleep testing. I've also stressed to him the importance of aggressive weight loss, and reminded him of his more responsibility to not drive if he is sleepy.

## 2014-01-25 ENCOUNTER — Ambulatory Visit (HOSPITAL_COMMUNITY)
Admission: RE | Admit: 2014-01-25 | Discharge: 2014-01-25 | Disposition: A | Discharge status: Home / Self Care | Payer: Medicaid Other | Source: Ambulatory Visit | Attending: Internal Medicine | Admitting: Internal Medicine

## 2014-01-25 ENCOUNTER — Encounter (HOSPITAL_COMMUNITY): Payer: Self-pay

## 2014-01-25 VITALS — BP 128/81 | HR 77

## 2014-01-25 DIAGNOSIS — I5022 Chronic systolic (congestive) heart failure: Secondary | ICD-10-CM | POA: Insufficient documentation

## 2014-01-25 LAB — BASIC METABOLIC PANEL
BUN: 14 mg/dL (ref 6–23)
CO2: 30 meq/L (ref 19–32)
Calcium: 9.5 mg/dL (ref 8.4–10.5)
Chloride: 103 mEq/L (ref 96–112)
Creatinine, Ser: 1.42 mg/dL — ABNORMAL HIGH (ref 0.50–1.35)
GFR calc Af Amer: 71 mL/min — ABNORMAL LOW (ref >90)
GFR calc non Af Amer: 61 mL/min — ABNORMAL LOW (ref >90)
Glucose, Bld: 94 mg/dL (ref 70–99)
Potassium: 4.8 mEq/L (ref 3.7–5.3)
SODIUM: 144 meq/L (ref 137–147)

## 2014-01-26 ENCOUNTER — Telehealth: Payer: Self-pay | Admitting: Pulmonary Disease

## 2014-01-26 ENCOUNTER — Encounter: Payer: Self-pay | Admitting: Pulmonary Disease

## 2014-01-26 DIAGNOSIS — G4733 Obstructive sleep apnea (adult) (pediatric): Secondary | ICD-10-CM

## 2014-01-26 NOTE — Telephone Encounter (Signed)
Pt needs ov to review sleep study results.  

## 2014-01-29 NOTE — Telephone Encounter (Signed)
Patient scheduled for an appt with [facility] 01/31/14 at 930 to review sleep study.

## 2014-01-31 ENCOUNTER — Ambulatory Visit: Payer: Self-pay | Admitting: Pulmonary Disease

## 2014-01-31 ENCOUNTER — Encounter: Payer: Self-pay | Admitting: Pulmonary Disease

## 2014-01-31 ENCOUNTER — Encounter (INDEPENDENT_AMBULATORY_CARE_PROVIDER_SITE_OTHER): Payer: Self-pay

## 2014-01-31 ENCOUNTER — Ambulatory Visit (INDEPENDENT_AMBULATORY_CARE_PROVIDER_SITE_OTHER): Payer: Medicaid Other | Admitting: Pulmonary Disease

## 2014-01-31 VITALS — BP 110/72 | HR 97 | Temp 97.9°F | Ht 72.0 in | Wt 349.2 lb

## 2014-01-31 DIAGNOSIS — G4733 Obstructive sleep apnea (adult) (pediatric): Secondary | ICD-10-CM

## 2014-01-31 NOTE — Patient Instructions (Signed)
Will start on cpap at a moderate pressure level.  Please call if you are having issues with tolerance. Work on weight loss followup with me in 8 weeks.  

## 2014-01-31 NOTE — Progress Notes (Signed)
   Subjective:    Patient ID: Bradley Wilson, male    DOB: 1976/02/02, 38 y.o.   MRN: [id number]  HPI The patient comes in today for followup after his recent home sleep study. He was found to have severe OSA, with an AHI of 75 events per hour and oxygen desaturation as low as 63%. I have reviewed the study with him in detail, and answered all of his questions.   Review of Systems  Constitutional: Negative for fever and unexpected weight change.  HENT: Negative for congestion, dental problem, ear pain, nosebleeds, postnasal drip, rhinorrhea, sinus pressure, sneezing, sore throat and trouble swallowing.   Eyes: Negative for redness and itching.  Respiratory: Negative for cough, chest tightness, shortness of breath and wheezing.   Cardiovascular: Negative for palpitations and leg swelling.  Gastrointestinal: Negative for nausea and vomiting.  Genitourinary: Negative for dysuria.  Musculoskeletal: Negative for joint swelling.  Skin: Negative for rash.  Neurological: Negative for headaches.  Hematological: Does not bruise/bleed easily.  Psychiatric/Behavioral: Negative for dysphoric mood. The patient is not nervous/anxious.        Objective:   Physical Exam Morbidly obese male in no acute distress Nose without purulence or discharge noted Neck without lymphadenopathy or thyromegaly Lower extremities with mild edema, no cyanosis Appears sleepy but appropriate, moves all 4 extremities.       Assessment & Plan:

## 2014-01-31 NOTE — Assessment & Plan Note (Signed)
The patient's home sleep test shows severe obstructive sleep apnea, and I have recommended a trial of CPAP while he is working on weight loss. The patient is agreeable to this approach. I will set the patient up on cpap at a moderate pressure level to allow for desensitization, and will troubleshoot the device over the next 4-6weeks if needed.  The pt is to call me if having issues with tolerance.  Will then optimize the pressure once patient is able to wear cpap on a consistent basis.

## 2014-02-05 ENCOUNTER — Telehealth: Payer: Self-pay | Admitting: Pulmonary Disease

## 2014-02-05 NOTE — Telephone Encounter (Signed)
Spoke with pt wife in regards to CPAP Pt cannot get machine covered by insurance (Medicaid) Pt currently receiving unemployment and is making to much. Pt is going to have to pay our of pocket $775  Dr Shelle Iron please advise. Thanks.

## 2014-02-06 NOTE — Telephone Encounter (Signed)
If the pt HAS medicaid currently, they will cover. If he does not, one alternative will be to talk with [facility] about the sleep organization that helps people get cpap for a hundred dollar donation.

## 2014-02-06 NOTE — Telephone Encounter (Signed)
Spoke with wife- she states that the patient does not have any insurance at this time; they are interested in the $100 donation program. I got paperwork from [place] for [facility] to fill out and mailed the information for the patient on how to make donation. Pt's wife is aware that we will call her once we have faxed our information back so they can make the donation. Will forward message to [facility] to document once he has completed his paper work for patient.    Paperwork given to [facility].

## 2014-02-06 NOTE — Telephone Encounter (Signed)
Do not have paperwork currently.

## 2014-02-07 NOTE — Telephone Encounter (Signed)
Spoke with Florentina Addison, she states that she gave this paperwork to you personally yesterday afternoon. Packet is on your desk.

## 2014-02-07 NOTE — Telephone Encounter (Signed)
done

## 2014-02-08 NOTE — Telephone Encounter (Signed)
Did [facility] give you this paperwork?

## 2014-02-08 NOTE — Telephone Encounter (Signed)
Per [facility], forms were given to [facility] to process.  Will send msg to [facility] to ensure that these get scanned into patient chart once process completed.

## 2014-02-09 ENCOUNTER — Other Ambulatory Visit: Payer: Self-pay | Admitting: Pulmonary Disease

## 2014-02-09 DIAGNOSIS — G4733 Obstructive sleep apnea (adult) (pediatric): Secondary | ICD-10-CM

## 2014-02-14 ENCOUNTER — Other Ambulatory Visit (HOSPITAL_BASED_OUTPATIENT_CLINIC_OR_DEPARTMENT_OTHER): Payer: Medicaid Other

## 2014-02-15 ENCOUNTER — Ambulatory Visit (HOSPITAL_COMMUNITY)
Admission: RE | Admit: 2014-02-15 | Discharge: 2014-02-15 | Disposition: A | Discharge status: Home / Self Care | Payer: Medicaid Other | Source: Ambulatory Visit | Attending: Internal Medicine | Admitting: Internal Medicine

## 2014-02-15 VITALS — BP 120/54 | HR 88 | Wt 349.2 lb

## 2014-02-15 DIAGNOSIS — I129 Hypertensive chronic kidney disease with stage 1 through stage 4 chronic kidney disease, or unspecified chronic kidney disease: Secondary | ICD-10-CM | POA: Insufficient documentation

## 2014-02-15 DIAGNOSIS — N183 Chronic kidney disease, stage 3 unspecified: Secondary | ICD-10-CM | POA: Insufficient documentation

## 2014-02-15 DIAGNOSIS — I10 Essential (primary) hypertension: Secondary | ICD-10-CM

## 2014-02-15 DIAGNOSIS — I509 Heart failure, unspecified: Secondary | ICD-10-CM | POA: Insufficient documentation

## 2014-02-15 DIAGNOSIS — Z79899 Other long term (current) drug therapy: Secondary | ICD-10-CM | POA: Insufficient documentation

## 2014-02-15 DIAGNOSIS — E669 Obesity, unspecified: Secondary | ICD-10-CM

## 2014-02-15 DIAGNOSIS — G4733 Obstructive sleep apnea (adult) (pediatric): Secondary | ICD-10-CM

## 2014-02-15 DIAGNOSIS — I5022 Chronic systolic (congestive) heart failure: Secondary | ICD-10-CM | POA: Insufficient documentation

## 2014-02-15 DIAGNOSIS — R635 Abnormal weight gain: Secondary | ICD-10-CM | POA: Insufficient documentation

## 2014-02-15 MED ORDER — FUROSEMIDE 40 MG PO TABS: 80.0000 mg | ORAL_TABLET | Freq: Every day | ORAL | Status: DC

## 2014-02-15 MED ORDER — CARVEDILOL 25 MG PO TABS: 25.0000 mg | ORAL_TABLET | Freq: Two times a day (BID) | ORAL | Status: DC

## 2014-02-15 MED ORDER — SPIRONOLACTONE 25 MG PO TABS: 25.0000 mg | ORAL_TABLET | Freq: Every day | ORAL | Status: DC

## 2014-02-15 NOTE — Patient Instructions (Signed)
Follow up in 3-4 weeks with an ECHO  Take carvedilol 25 mg twice a day.  Take 80 mg of lasix daily and an extra 40 mg for 3 pound weight gain in 24 hours   Do the following things EVERYDAY: 1) Weigh yourself in the morning before breakfast. Write it down and keep it in a log. 2) Take your medicines as prescribed 3) Eat low salt foods-Limit salt (sodium) to 2000 mg per day.  4) Stay as active as you can everyday 5) Limit all fluids for the day to less than 2 liters

## 2014-02-15 NOTE — Progress Notes (Signed)
Patient ID: Bradley Wilson, male   DOB: November 20, 1976, 38 y.o.   MRN: [id number]  Cardiologist: Dr. Shirlee Latch PCP:   HPI: Bradley Wilson is a ewis is a 38 y/o with history of HTN, CKD, morbid obesity, OSA and new onset systolic HF. NICM had LHC 12/08/13.  Admitted to the hospital 12/05/13 with SOB and weight gain. ECHO showed EF 25%, grade II DD and reduced RV systolic fx. LHC showed no CAD. SPEP was performed (normal except elevated free kappa light chains 3.97) and HIV negative. Discharge weight 330 lbs.   He returns for follow up Last visit lisinopril was increased to 10 mg twice a day. He has had sleep study and demonstrated severe obstructive sleep aprn. Denies SOB. +Orthopnea Sleeps on 4 large pillows. Weight at home trending up from 333-to 352 pounds. Says he was out of lasix for 4 days. He restarted lasix 2 days ago. Not exercising.  Drinking > 2 liters of fluid per day. Eating high salt foods such as pizza. Says he eats lots of ice cream.   01/18/14 K 4.3 Creatinine 1.56 01/25/14 K 4.8 Creatinine 1.42  SH: Not currently working . Was work ing nighty shift loading shift.   ROS: All systems negative except as listed in HPI, PMH and Problem List.  Past Medical History  Diagnosis Date  . Hypertension   . Chronic systolic CHF (congestive heart failure)     a. Dx 11/2013 - likely due to hypertensive heart disease/NICM (EF 25%, normal cors).  . CKD (chronic kidney disease) stage 3, GFR 40-59 ml/min   . Enlarged lymph node   . Morbid obesity   . Sleep apnea     Current Outpatient Prescriptions  Medication Sig Dispense Refill  . carvedilol (COREG) 12.5 MG tablet Take 1.5 tablets (18.75 mg total) by mouth 2 (two) times daily with a meal.  90 tablet  3  . furosemide (LASIX) 40 MG tablet Take 2 tablets (80 mg total) by mouth daily.  60 tablet  3  . hydrALAZINE (APRESOLINE) 50 MG tablet Take 1.5 tablets (75 mg total) by mouth every 8 (eight) hours.  135 tablet  6  . isosorbide mononitrate (IMDUR) 60 MG 24  hr tablet Take 1 tablet (60 mg total) by mouth daily.  30 tablet  6  . lisinopril (PRINIVIL,ZESTRIL) 10 MG tablet Take 1 tablet (10 mg total) by mouth 2 (two) times daily.  60 tablet  3  . Multiple Vitamin (MULTI-VITAMIN DAILY PO) Take by mouth.      . potassium chloride SA (K-DUR,KLOR-CON) 20 MEQ tablet Take 1 tablet (20 mEq total) by mouth daily.  30 tablet  6  . spironolactone (ALDACTONE) 25 MG tablet Take 1 tablet (25 mg total) by mouth daily.  30 tablet  6   No current facility-administered medications for this encounter.    Filed Vitals:   02/15/14 0908  BP: 120/54  Pulse: 88  Weight: 349 lb 4 oz (158.419 kg)  SpO2: 94%    PHYSICAL EXAM: General:  Well appearing. No resp difficulty; obese; wife present  HEENT: normal Neck: supple. JVP difficult to assess d/t body habitus but does not appear elevated. Carotids 2+ bilaterally; no bruits. No lymphadenopathy or thryomegaly appreciated. Cor: PMI normal. Regular rate & rhythm. No rubs, gallops or murmurs. Lungs: clear Abdomen: Obese, soft, nontender, nondistended. No hepatosplenomegaly. No bruits or masses. Good bowel sounds. Extremities: no cyanosis, clubbing, rash, no lower extremity edema Neuro: alert & orientedx3, cranial nerves grossly intact. Moves all 4  extremities w/o difficulty. Affect pleasant.   ASSESSMENT & PLAN:  1) Chronic systolic HF: NICM, EF 25% (11/2013) - NYHA II symptoms and volume status stable despite weight gain. Weight likely due to excessive intake. Will continue lasix 80 mg BID with an extra 40 mg of lasix for 40 pound weight gain in 24 hours. Continue spironolactone 25 mg daily.  -Increase carvedilol to 25 mg twice a day which is goal dose.   -Continue  hydralazine 75 mg TID and IMDUR 60 mg daily.  - Continue lisinopril 10 mg BID.  - Follow up 3-4 weeks with ECHO which will be 3 months of HF medication optimization. If EF < 35% will refer to for ICD.  - Reinforced the need and importance of daily weights,  a low sodium diet, and fluid restriction (less than 2 L a day). Instructed to call the HF clinic if weight increases more than 3 lbs overnight or 5 lbs in a week.  2) HTN -Stable on ACE-I, Spiro and hydralazine. Increase carvedilol 25 mg twice a day. Marland Kitchen.   3) Obesity - Have discussed portion control . Have asked him to start exercsing. Encouraged to walk 15 minutes per day.  4) Severe OSA - Severe obstructive sleep apnea per Dr Shelle Ironlance. Awaiting CPAP placement.  5) CKD, stage III - baseline Cr 1.5-1.7.   Follow up in 3 -4 weeks with an ECHO.  CLEGG,AMY NP-C 9:30 AM

## 2014-02-20 ENCOUNTER — Ambulatory Visit (HOSPITAL_BASED_OUTPATIENT_CLINIC_OR_DEPARTMENT_OTHER): Payer: Medicaid Other | Attending: Pulmonary Disease | Admitting: Radiology

## 2014-02-20 DIAGNOSIS — R5383 Other fatigue: Secondary | ICD-10-CM

## 2014-02-20 DIAGNOSIS — G4733 Obstructive sleep apnea (adult) (pediatric): Secondary | ICD-10-CM

## 2014-02-20 DIAGNOSIS — R0683 Snoring: Secondary | ICD-10-CM

## 2014-02-20 DIAGNOSIS — R5381 Other malaise: Secondary | ICD-10-CM

## 2014-02-22 ENCOUNTER — Telehealth: Payer: Self-pay | Admitting: Pulmonary Disease

## 2014-02-22 DIAGNOSIS — G4733 Obstructive sleep apnea (adult) (pediatric): Secondary | ICD-10-CM

## 2014-02-22 NOTE — Telephone Encounter (Signed)
Called spoke with patient's spouse Avis.  Per Avis, pt had his mask fitting on 3.17.15 at the sleep center.  However, pt still experienced mild snoring on the 10cm.  She would like to know if this pressure will need to be adjusted.  Also, pt's CPAP machine was received by [facility] (pt was enrolled in their financial assistance program with the $100 copay).  [facility] is now needing an order for the CPAP with pressures, etc.  Dr Shelle Iron please advise, thank you.  Pt last seen 2.25.15 w/ KC: Patient Instructions     Will start on cpap at a moderate pressure level. Please call if you are having issues with tolerance.  Work on weight loss  followup with me in 8 weeks.

## 2014-02-23 NOTE — Telephone Encounter (Signed)
Spouse aware of recs. Order was not placed to change pressure settings. I have done so. Nothing further needed

## 2014-02-23 NOTE — Telephone Encounter (Signed)
Order placed also as FYI of mask given to patient at mask fitting desensitization.

## 2014-02-23 NOTE — Telephone Encounter (Signed)
Pt returned call

## 2014-02-23 NOTE — Telephone Encounter (Signed)
lmomtcb x1 for pt 

## 2014-02-23 NOTE — Telephone Encounter (Signed)
Called spoke with Bradley Wilson. She reports when we goes into a deep sleep he starts snoring. She feels he needs the pressure adjusted bc he also feels like he is suffocating. She does not feel he is tolerating this pressure setting. Please advise KC thanks

## 2014-02-23 NOTE — Telephone Encounter (Signed)
Please send order to dme to put his machine on auto 5-20cm.  Make sure they bring machine to visit with me next month.

## 2014-02-23 NOTE — Telephone Encounter (Signed)
Let her know that I am aware that his current pressure is not high enough, but we typically start low to allow pt to adapt before turning pressure up higher.  If pt is doing well on current pressure, can turn up higher and see how he does.  Let me know if she thinks tolerance is good.

## 2014-02-27 ENCOUNTER — Telehealth: Payer: Self-pay | Admitting: Pulmonary Disease

## 2014-02-27 NOTE — Telephone Encounter (Signed)
I show order was faxed to [facility] 02/23/14 They are now closed and will need to call in AM

## 2014-02-28 NOTE — Telephone Encounter (Signed)
I LMTCB for Mandy to discuss this pt. See previous phone note dated 02/22/14. Carron Curie, CMA

## 2014-02-28 NOTE — Telephone Encounter (Signed)
Pt is not with [facility]. Pt doesn't have Medicaid any longer and has completed the form for the [employer] CPAP Assistance Program. CPAP machine has been sent to [facility] and patient has an appointment for CPAP set up on [address] tomorrow (03/01/14). Staff message sent to [facility] for [facility] to set machine on auto 5-20 cm (per last ov note with Dr. Shelle Iron) and the style of mask (per Marita Kansas at sleep lab) is a Radiographer, therapeutic & Paykel Medium size Simplus Full Face mask with a Medium/Large Headgear.  Called and spoke with Synetta Fail at [facility] and advised her that pt is using [facility] and to shred any orders that were sent to her previously.  Called and spoke with patient and he is aware that I have called and spoke with [facility] and advised them to D/C any order that has been sent and not to contact the patient. Rhonda J Cobb

## 2014-02-28 NOTE — Telephone Encounter (Signed)
Mandy returned call.  States that they did receive the order.  But, Medicaid will not cover the cost w a home sleep study-has to be done in a lab.  States they are working on it.  Angelica Chessman can be reached at [phone]  [facility]

## 2014-02-28 NOTE — Telephone Encounter (Signed)
Called and spoke with [facility]. Medicaid is not wanting to cover the CPAP bc pt had home sleep study. Medicaid is requesting an in lab sleep study.  She reports RX for CPAP was suppose to be sent to [facility] and not [facility]. Per Angelica Chessman they faxed RX over to [facility]. Angelica Chessman reports they received confirmation on there end they received this. Per [place] someone from the office spoke with rhonda regarding this. Please advise Bjorn Loser thanks

## 2014-03-03 ENCOUNTER — Other Ambulatory Visit: Payer: Self-pay | Admitting: Cardiology

## 2014-03-03 ENCOUNTER — Telehealth: Payer: Self-pay | Admitting: Cardiology

## 2014-03-03 DIAGNOSIS — I509 Heart failure, unspecified: Secondary | ICD-10-CM

## 2014-03-03 MED ORDER — FUROSEMIDE 40 MG PO TABS: 80.0000 mg | ORAL_TABLET | Freq: Every day | ORAL | Status: DC

## 2014-03-03 NOTE — Telephone Encounter (Signed)
Pt called that he was out of Lasix, he had tried to refill through his usual pharmacy but they would not refill.  I called into [facility] on [address].  [id number], Lasix 40 mg 2 tabs daily and 1 tab prn  #80 with 1 refill.

## 2014-03-20 ENCOUNTER — Encounter (HOSPITAL_COMMUNITY): Payer: Medicaid Other

## 2014-03-20 ENCOUNTER — Ambulatory Visit (HOSPITAL_COMMUNITY): Payer: Medicaid Other

## 2014-03-23 ENCOUNTER — Ambulatory Visit (HOSPITAL_COMMUNITY)
Admission: RE | Admit: 2014-03-23 | Discharge: 2014-03-23 | Disposition: A | Discharge status: Home / Self Care | Payer: Self-pay | Source: Ambulatory Visit | Attending: Internal Medicine | Admitting: Internal Medicine

## 2014-03-23 ENCOUNTER — Ambulatory Visit (HOSPITAL_BASED_OUTPATIENT_CLINIC_OR_DEPARTMENT_OTHER)
Admission: RE | Admit: 2014-03-23 | Discharge: 2014-03-23 | Disposition: A | Discharge status: Home / Self Care | Payer: Self-pay | Source: Ambulatory Visit | Attending: Internal Medicine | Admitting: Internal Medicine

## 2014-03-23 VITALS — BP 118/76 | HR 65 | Wt 355.8 lb

## 2014-03-23 DIAGNOSIS — I10 Essential (primary) hypertension: Secondary | ICD-10-CM | POA: Insufficient documentation

## 2014-03-23 DIAGNOSIS — G4733 Obstructive sleep apnea (adult) (pediatric): Secondary | ICD-10-CM

## 2014-03-23 DIAGNOSIS — I517 Cardiomegaly: Secondary | ICD-10-CM

## 2014-03-23 DIAGNOSIS — I5022 Chronic systolic (congestive) heart failure: Secondary | ICD-10-CM | POA: Insufficient documentation

## 2014-03-23 LAB — BASIC METABOLIC PANEL
BUN: 19 mg/dL (ref 6–23)
CALCIUM: 9.7 mg/dL (ref 8.4–10.5)
CO2: 26 mEq/L (ref 19–32)
CREATININE: 1.5 mg/dL — AB (ref 0.50–1.35)
Chloride: 102 mEq/L (ref 96–112)
GFR, EST AFRICAN AMERICAN: 66 mL/min — AB (ref >90)
GFR, EST NON AFRICAN AMERICAN: 57 mL/min — AB (ref >90)
GLUCOSE: 84 mg/dL (ref 70–99)
POTASSIUM: 4.2 meq/L (ref 3.7–5.3)
Sodium: 144 mEq/L (ref 137–147)

## 2014-03-23 LAB — PRO B NATRIURETIC PEPTIDE: Pro B Natriuretic peptide (BNP): 84.3 pg/mL (ref 0–125)

## 2014-03-23 NOTE — Patient Instructions (Signed)
Labs today  We will contact you in 3 months to schedule your next appointment.  

## 2014-03-25 NOTE — Progress Notes (Signed)
Patient ID: Bradley Wilson, male   DOB: 1976-01-22, 38 y.o.   MRN: [id number]  Cardiologist: Dr. Shirlee Latch  HPI: [name] is a 38 y/o with history of HTN, CKD, morbid obesity, OSA and chronic systolic HF.  Nonischemic cardiomyopathy, had LHC 12/08/13.  Admitted to the hospital 12/05/13 with SOB and weight gain. ECHO showed EF 25%, grade II DD and reduced RV systolic fx. LHC showed no CAD. HIV negative. Discharge weight 330 lbs.   He returns for followup.  He has been doing well.  He has been on his CPAP for 40 weeks and is much less fatigued.  He is sleeping better.  No significant exertional dyspnea.  No orthopnea, PND, or chest pain.  He can climb a flight of steps without problems.  He does not get lightheaded.   I reviewed today's echo.  EF now 55% with moderate LVH, moderate diastolic dysfunction, and normal RV size and systolic function.   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   ROS: All systems negative except as listed in HPI, PMH and Problem List.  Past Medical History  Diagnosis Date  . Hypertension   . Chronic systolic CHF (congestive heart failure)     a. Dx 11/2013 - likely due to hypertensive heart disease/NICM (EF 25%, normal cors).  . CKD (chronic kidney disease) stage 3, GFR 30-59 ml/min   . Enlarged lymph node   . Morbid obesity   . Sleep apnea     Current Outpatient Prescriptions  Medication Sig Dispense Refill  . carvedilol (COREG) 25 MG tablet Take 1 tablet (25 mg total) by mouth 2 (two) times daily with a meal.  60 tablet  6  . furosemide (LASIX) 40 MG tablet Take 2 tablets (80 mg total) by mouth daily. 80 mg once a day and an extra 40 mg for 3 pound weight gain in 24 hours.  80 tablet  3  . hydrALAZINE (APRESOLINE) 50 MG tablet Take 1.5 tablets (75 mg total) by mouth every 8 (eight) hours.  135 tablet  6  . isosorbide mononitrate (IMDUR) 60 MG 24 hr tablet Take 1 tablet (60 mg total)  by mouth daily.  30 tablet  6  . lisinopril (PRINIVIL,ZESTRIL) 10 MG tablet Take 1 tablet (10 mg total) by mouth 2 (two) times daily.  60 tablet  3  . Multiple Vitamin (MULTI-VITAMIN DAILY PO) Take by mouth.      . potassium chloride SA (K-DUR,KLOR-CON) 20 MEQ tablet Take 1 tablet (20 mEq total) by mouth daily.  30 tablet  6  . spironolactone (ALDACTONE) 25 MG tablet Take 1 tablet (25 mg total) by mouth daily.  30 tablet  6   No current facility-administered medications for this encounter.    Filed Vitals:   03/23/14 1040  BP: 118/76  Pulse: 65  Weight: 355 lb 12 oz (161.367 kg)  SpO2: 93%    PHYSICAL EXAM: General:  Well appearing. No resp difficulty; obese HEENT: normal Neck: Thick. JVP difficult to assess d/t body habitus but does not appear elevated. Carotids 2+ bilaterally; no bruits. No lymphadenopathy or thryomegaly appreciated. Cor: PMI normal. Regular rate & rhythm. No rubs, gallops or murmurs. Lungs: clear Abdomen: Obese, soft, nontender, nondistended. No hepatosplenomegaly. No bruits or masses. Good bowel sounds. Extremities: no cyanosis, clubbing, rash, no lower extremity edema Neuro: alert & orientedx3, cranial nerves grossly intact. Moves all 4 extremities w/o difficulty. Affect pleasant.  ASSESSMENT & PLAN:  1) Chronic systolic HF: NICM, EF 25% (11/2013).  NYHA II symptoms and volume status stable.  I reviewed today's echo.  EF is back to 55% with moderate LVH.  Possibly, cardiomyopathy was due to uncontrolled HTN.  - Will continue lasix 80 mg daily with spironolactone 25 mg daily.  - Continue current hydralazine/Imdur, Coreg, and lisinopril. - BMET/BNP today.   - Reinforced the need and importance of daily weights, a low sodium diet, and fluid restriction (less than 2 L a day). Instructed to call the HF clinic if weight increases more than 3 lbs overnight or 5 lbs in a week.  2) HTN: Improved with meds.  4) Severe OSA: Severe obstructive sleep apnea per Dr Shelle Ironlance.  Now has CPAP.  5) CKD, stage IIIb: Baseline Cr 1.5-1.7. As above, getting BMET today.   Followup in 3 months.   Bradley Wilson All  03/25/2014

## 2014-03-28 ENCOUNTER — Encounter: Payer: Self-pay | Admitting: Pulmonary Disease

## 2014-03-28 ENCOUNTER — Ambulatory Visit (INDEPENDENT_AMBULATORY_CARE_PROVIDER_SITE_OTHER): Payer: Self-pay | Admitting: Pulmonary Disease

## 2014-03-28 VITALS — BP 118/88 | HR 72 | Temp 96.9°F | Ht 72.0 in | Wt 360.8 lb

## 2014-03-28 DIAGNOSIS — G4733 Obstructive sleep apnea (adult) (pediatric): Secondary | ICD-10-CM

## 2014-03-28 NOTE — Patient Instructions (Signed)
Will have advanced set your machine on 15 for your pressure.  Talk with them about the ramp setting as well. Work on [employer] loss Will see you back in 89mos if you are doing well.  If having issues with cpap, let us know.

## 2014-03-28 NOTE — Progress Notes (Signed)
   Subjective:    Patient ID: Bradley Wilson, male    DOB: [birth year], 38 y.o.   MRN: [id number]  HPI The patient comes in today for followup of his obstructive sleep apnea. The patient has had an auto titration at home which showed his optimal pressure to be 15 cm, but his machine has never been set up with a new pressure. Therefore, he has not been on treatment since being off the auto device. He did get a mask fitting at the sleep Center, and has a mask that works well for him.   Review of Systems  Constitutional: Negative for fever and unexpected weight change.  HENT: Negative for congestion, dental problem, ear pain, nosebleeds, postnasal drip, rhinorrhea, sinus pressure, sneezing, sore throat and trouble swallowing.   Eyes: Negative for redness and itching.  Respiratory: Negative for cough, chest tightness, shortness of breath and wheezing.   Cardiovascular: Negative for palpitations and leg swelling.  Gastrointestinal: Negative for nausea and vomiting.  Genitourinary: Negative for dysuria.  Musculoskeletal: Negative for joint swelling.  Skin: Negative for rash.  Neurological: Negative for headaches.  Hematological: Does not bruise/bleed easily.  Psychiatric/Behavioral: Negative for dysphoric mood. The patient is not nervous/anxious.        Objective:   Physical Exam Morbidly obese male in no acute distress Nose without purulence or discharge noted Neck without lymphadenopathy or thyromegaly Lower extremities with mild edema, no cyanosis Alert and oriented, moves all 4 extremities.       Assessment & Plan:

## 2014-03-28 NOTE — Assessment & Plan Note (Signed)
The pt's pressure has been optimized to 15 cm, and we will have his home care company set his current machine to that pressure. I also encouraged the patient to work aggressively on weight loss over the next 6 months, when I will see him back to check on progress. I have asked him to call me if he has issues with tolerance.

## 2014-06-04 ENCOUNTER — Other Ambulatory Visit (HOSPITAL_COMMUNITY): Payer: Self-pay | Admitting: Anesthesiology

## 2014-06-10 IMAGING — CR DG CHEST 2V
2 series · 2 of 2 positions shown · non-contrast
Comparison: January 23, 2013.

CLINICAL DATA: Shortness of breath.

EXAM:
CHEST  2 VIEW

[w chest pa]
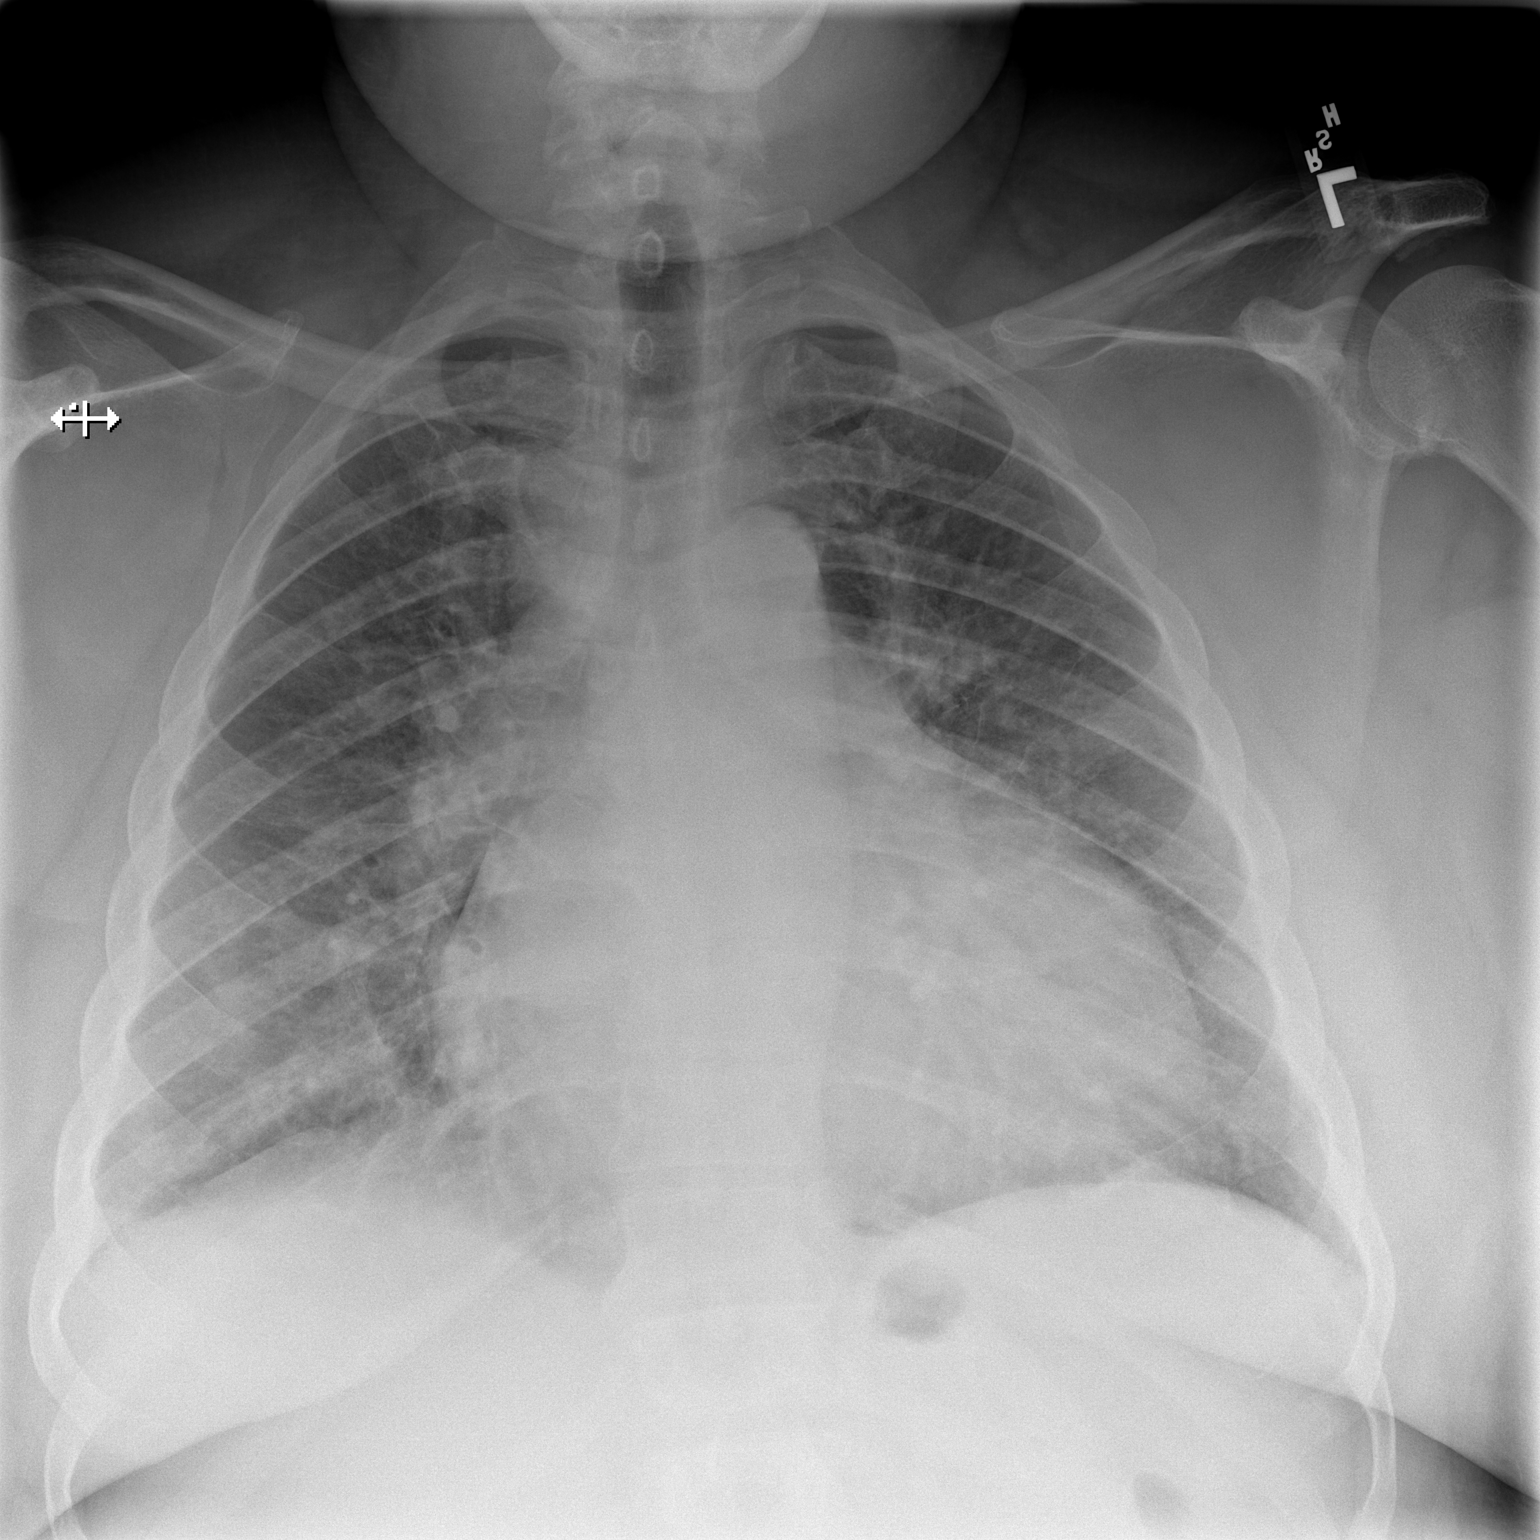

[w chest lat]
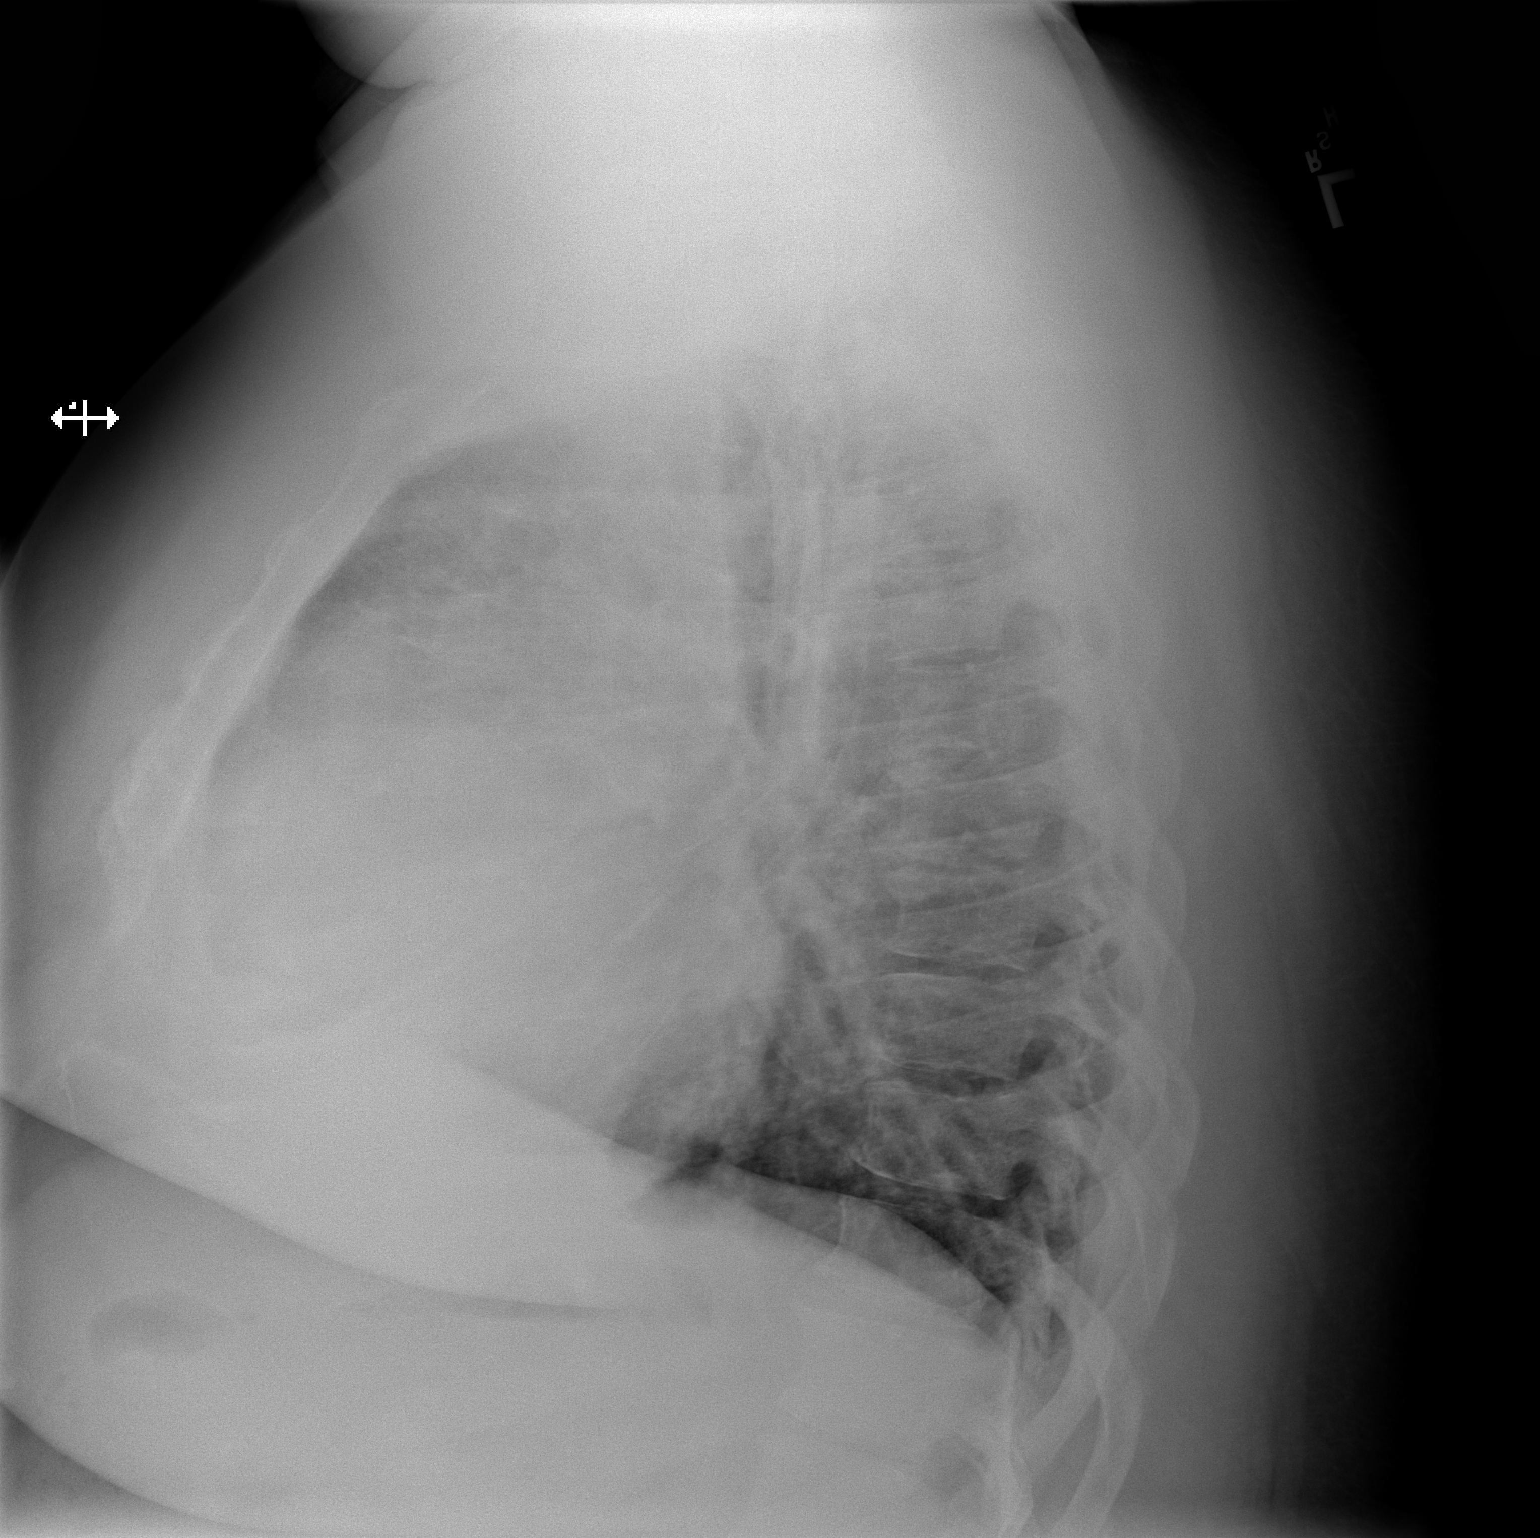

[2 of 2 positions shown; findings below may reference images not displayed]

FINDINGS: Stable mild cardiomegaly. No pleural effusion or pneumothorax is
noted. Bony thorax is intact. Minimal right basilar opacity is noted
concerning for subsegmental atelectasis or possibly pneumonia.
IMPRESSION: Minimal right basilar opacity is noted concerning for subsegmental
atelectasis or pneumonia.

## 2014-07-04 ENCOUNTER — Other Ambulatory Visit: Payer: Self-pay | Admitting: Physician Assistant

## 2014-07-06 ENCOUNTER — Encounter (HOSPITAL_COMMUNITY): Payer: Self-pay | Admitting: Vascular Surgery

## 2014-07-11 ENCOUNTER — Other Ambulatory Visit: Payer: Self-pay | Admitting: Physician Assistant

## 2014-07-11 DIAGNOSIS — I509 Heart failure, unspecified: Secondary | ICD-10-CM

## 2014-07-17 ENCOUNTER — Other Ambulatory Visit: Payer: Self-pay | Admitting: Physician Assistant

## 2014-07-24 ENCOUNTER — Other Ambulatory Visit: Payer: Self-pay | Admitting: Physician Assistant

## 2014-09-06 ENCOUNTER — Other Ambulatory Visit: Payer: Self-pay | Admitting: *Deleted

## 2014-09-12 MED ORDER — FUROSEMIDE 40 MG PO TABS
80.0000 mg | ORAL_TABLET | Freq: Every day | ORAL | Status: DC
Start: 2014-09-12 — End: 2016-01-15

## 2014-09-27 ENCOUNTER — Ambulatory Visit: Payer: Self-pay | Admitting: Pulmonary Disease

## 2014-10-15 ENCOUNTER — Ambulatory Visit: Payer: Self-pay | Admitting: Pulmonary Disease

## 2014-11-15 ENCOUNTER — Encounter (HOSPITAL_COMMUNITY): Payer: Self-pay | Admitting: Cardiology

## 2014-11-19 ENCOUNTER — Ambulatory Visit: Payer: Self-pay | Admitting: Pulmonary Disease

## 2015-01-29 ENCOUNTER — Other Ambulatory Visit (HOSPITAL_COMMUNITY): Payer: Self-pay | Admitting: Adult Health

## 2015-01-30 ENCOUNTER — Other Ambulatory Visit (HOSPITAL_COMMUNITY): Payer: Self-pay | Admitting: Cardiology

## 2015-01-30 DIAGNOSIS — I5022 Chronic systolic (congestive) heart failure: Secondary | ICD-10-CM

## 2015-01-30 MED ORDER — LISINOPRIL 10 MG PO TABS: 10.0000 mg | ORAL_TABLET | Freq: Two times a day (BID) | ORAL | Status: DC

## 2015-03-11 ENCOUNTER — Other Ambulatory Visit (HOSPITAL_COMMUNITY): Payer: Self-pay | Admitting: *Deleted

## 2015-03-11 ENCOUNTER — Encounter (HOSPITAL_COMMUNITY): Payer: Self-pay | Admitting: *Deleted

## 2015-03-11 MED ORDER — CARVEDILOL 25 MG PO TABS: 25.0000 mg | ORAL_TABLET | Freq: Two times a day (BID) | ORAL | Status: DC

## 2015-04-02 ENCOUNTER — Inpatient Hospital Stay (HOSPITAL_COMMUNITY): Admission: RE | Admit: 2015-04-02 | Payer: Self-pay | Source: Ambulatory Visit

## 2015-04-05 ENCOUNTER — Other Ambulatory Visit (HOSPITAL_COMMUNITY): Payer: Self-pay | Admitting: Internal Medicine

## 2015-04-12 ENCOUNTER — Other Ambulatory Visit (HOSPITAL_COMMUNITY): Payer: Self-pay | Admitting: *Deleted

## 2015-04-12 DIAGNOSIS — I5022 Chronic systolic (congestive) heart failure: Secondary | ICD-10-CM

## 2015-04-12 MED ORDER — LISINOPRIL 10 MG PO TABS: 10.0000 mg | ORAL_TABLET | Freq: Two times a day (BID) | ORAL | Status: DC

## 2015-04-14 ENCOUNTER — Other Ambulatory Visit (HOSPITAL_COMMUNITY): Payer: Self-pay | Admitting: Internal Medicine

## 2015-04-22 ENCOUNTER — Other Ambulatory Visit (HOSPITAL_COMMUNITY): Payer: Self-pay | Admitting: *Deleted

## 2015-04-22 MED ORDER — CARVEDILOL 25 MG PO TABS: 25.0000 mg | ORAL_TABLET | Freq: Two times a day (BID) | ORAL | Status: DC

## 2016-01-01 ENCOUNTER — Other Ambulatory Visit (HOSPITAL_COMMUNITY): Payer: Self-pay | Admitting: *Deleted

## 2016-01-01 DIAGNOSIS — I5022 Chronic systolic (congestive) heart failure: Secondary | ICD-10-CM

## 2016-01-01 MED ORDER — HYDRALAZINE HCL 50 MG PO TABS: ORAL_TABLET | ORAL | Status: DC

## 2016-01-01 MED ORDER — LISINOPRIL 10 MG PO TABS: 10.0000 mg | ORAL_TABLET | Freq: Two times a day (BID) | ORAL | Status: DC

## 2016-01-01 MED ORDER — POTASSIUM CHLORIDE ER 20 MEQ PO TBCR: 1.0000 | EXTENDED_RELEASE_TABLET | Freq: Every day | ORAL | Status: DC

## 2016-01-01 MED ORDER — ISOSORBIDE MONONITRATE ER 60 MG PO TB24: 60.0000 mg | ORAL_TABLET | Freq: Every day | ORAL | Status: DC

## 2016-01-01 MED ORDER — CARVEDILOL 25 MG PO TABS: 25.0000 mg | ORAL_TABLET | Freq: Two times a day (BID) | ORAL | Status: DC

## 2016-01-14 ENCOUNTER — Other Ambulatory Visit (HOSPITAL_COMMUNITY): Payer: Self-pay | Admitting: Internal Medicine

## 2016-01-15 ENCOUNTER — Ambulatory Visit (HOSPITAL_COMMUNITY)
Admission: RE | Admit: 2016-01-15 | Discharge: 2016-01-15 | Disposition: A | Discharge status: Home / Self Care | Payer: 59 | Source: Ambulatory Visit | Attending: Internal Medicine | Admitting: Internal Medicine

## 2016-01-15 ENCOUNTER — Other Ambulatory Visit (HOSPITAL_COMMUNITY): Payer: Self-pay | Admitting: *Deleted

## 2016-01-15 ENCOUNTER — Encounter (HOSPITAL_COMMUNITY): Payer: Self-pay | Admitting: Internal Medicine

## 2016-01-15 VITALS — BP 142/80 | HR 77 | Wt 358.5 lb

## 2016-01-15 DIAGNOSIS — I130 Hypertensive heart and chronic kidney disease with heart failure and stage 1 through stage 4 chronic kidney disease, or unspecified chronic kidney disease: Secondary | ICD-10-CM | POA: Diagnosis not present

## 2016-01-15 DIAGNOSIS — Z79899 Other long term (current) drug therapy: Secondary | ICD-10-CM | POA: Insufficient documentation

## 2016-01-15 DIAGNOSIS — G4733 Obstructive sleep apnea (adult) (pediatric): Secondary | ICD-10-CM | POA: Insufficient documentation

## 2016-01-15 DIAGNOSIS — N183 Chronic kidney disease, stage 3 (moderate): Secondary | ICD-10-CM | POA: Insufficient documentation

## 2016-01-15 DIAGNOSIS — I10 Essential (primary) hypertension: Secondary | ICD-10-CM | POA: Diagnosis not present

## 2016-01-15 DIAGNOSIS — I5022 Chronic systolic (congestive) heart failure: Secondary | ICD-10-CM

## 2016-01-15 DIAGNOSIS — I428 Other cardiomyopathies: Secondary | ICD-10-CM | POA: Insufficient documentation

## 2016-01-15 MED ORDER — LISINOPRIL 10 MG PO TABS: 10.0000 mg | ORAL_TABLET | Freq: Every day | ORAL | Status: DC

## 2016-01-15 MED ORDER — LISINOPRIL 10 MG PO TABS: 10.0000 mg | ORAL_TABLET | Freq: Two times a day (BID) | ORAL | Status: DC

## 2016-01-15 MED ORDER — ISOSORBIDE MONONITRATE ER 60 MG PO TB24: 60.0000 mg | ORAL_TABLET | Freq: Every day | ORAL | Status: DC

## 2016-01-15 MED ORDER — CARVEDILOL 25 MG PO TABS: 25.0000 mg | ORAL_TABLET | Freq: Two times a day (BID) | ORAL | Status: DC

## 2016-01-15 MED ORDER — SPIRONOLACTONE 25 MG PO TABS: 25.0000 mg | ORAL_TABLET | Freq: Every day | ORAL | Status: DC

## 2016-01-15 MED ORDER — POTASSIUM CHLORIDE ER 20 MEQ PO TBCR: 1.0000 | EXTENDED_RELEASE_TABLET | Freq: Every day | ORAL | Status: DC

## 2016-01-15 MED ORDER — HYDRALAZINE HCL 50 MG PO TABS: ORAL_TABLET | ORAL | Status: DC

## 2016-01-15 MED FILL — ?SPIRONOLACTONE 25 MG TABLE: 25 | 30 days supply | Qty: 30 | Fill #0 | Status: TO

## 2016-01-15 MED FILL — LISINOPRIL 10 MG TABLET: 10 | 30 days supply | Qty: 60 | Fill #0

## 2016-01-15 MED FILL — ?CARVEDILOL 25 MG TABLET: 25 | 30 days supply | Qty: 60 | Fill #0 | Status: TO

## 2016-01-15 NOTE — Progress Notes (Signed)
Patient ID: Bradley Wilson, male   DOB: Oct 11, 1976, 40 y.o.   MRN: [id number]   Cardiologist: Dr. Shirlee Latch PCP: Dr Christain Sacramento   HPI: Bradley Wilson is a 40 y/o with history of HTN, CKD, morbid obesity, OSA and chronic systolic HF.  Nonischemic cardiomyopathy, had LHC 12/08/13.  Admitted to the hospital 12/05/13 with SOB and weight gain. ECHO showed EF 25%, grade II DD and reduced RV systolic fx. LHC showed no CAD. HIV negative. Discharge weight 330 lbs.   He returns for follow up.  He has not been seen in over a year. Denies SOB/PND/Orthopnea. Weight at home down 350 pounds. Has not had meds in the last 3 weeks . Has not had lasix and hydralazine over a year. Stopped by PCP. Marland Kitchen Using CPAP every night. Just got a new job and now has benefits.   ECHO 2015 EF 55% moderate LVH, moderate diastolic dysfunction, and normal RV size and systolic function.   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   ROS: All systems negative except as listed in HPI, PMH and Problem List.  Past Medical History  Diagnosis Date  . Hypertension   . Chronic systolic CHF (congestive heart failure) (HCC)     a. Dx 11/2013 - likely due to hypertensive heart disease/NICM (EF 25%, normal cors).  . CKD (chronic kidney disease) stage 3, GFR 30-59 ml/min   . Enlarged lymph node   . Morbid obesity (HCC)   . Sleep apnea     Current Outpatient Prescriptions  Medication Sig Dispense Refill  . carvedilol (COREG) 25 MG tablet Take 1 tablet (25 mg total) by mouth 2 (two) times daily with a meal. (Patient not taking: Reported on 01/15/2016) 42 tablet 0  . isosorbide mononitrate (IMDUR) 60 MG 24 hr tablet Take 1 tablet (60 mg total) by mouth daily. (Patient not taking: Reported on 01/15/2016) 30 tablet 0  . lisinopril (PRINIVIL,ZESTRIL) 10 MG tablet Take 1 tablet (10 mg total) by mouth 2 (two) times  daily. NEEDS OFFICE VISIT (Patient not taking: Reported on 01/15/2016) 42 tablet 0  . Multiple Vitamin (MULTI-VITAMIN DAILY PO) Take by mouth. Reported on 01/15/2016    . Potassium Chloride ER (K-TAB) 20 MEQ TBCR Take 1 tablet by mouth daily. (Patient not taking: Reported on 01/15/2016) 30 tablet 0  . spironolactone (ALDACTONE) 25 MG tablet TAKE 1 TABLET BY MOUTH ONCE DAILY (Patient not taking: Reported on 01/15/2016) 30 tablet 6   No current facility-administered medications for this encounter.    Filed Vitals:   01/15/16 1053  BP: 142/80  Pulse: 77  Weight: 358 lb 8 oz (162.615 kg)  SpO2: 98%    PHYSICAL EXAM: General:  Well appearing. No resp difficulty; obese HEENT: normal Neck: Thick. JVP difficult to assess d/t body habitus but does not appear elevated. Carotids 2+ bilaterally; no bruits. No lymphadenopathy or thryomegaly appreciated. Cor: PMI normal. Regular rate & rhythm. No rubs, gallops or murmurs. Lungs: clear Abdomen: Obese, soft, nontender, nondistended. No hepatosplenomegaly. No bruits or masses. Good bowel sounds. Extremities: no cyanosis, clubbing, rash, no lower extremity edema Neuro: alert & orientedx3, cranial nerves grossly intact. Moves all 4 extremities w/o difficulty. Affect pleasant.   ASSESSMENT & PLAN:  1) Chronic systolic HF: NICM, EF 55% 2015.  He has been out of meds for 3 weeks. Doing well. NYHA II. Volume status  stable. He has been off lasix for a year.  Today I will restart 25 mg spiro daily, carvedilol 25 mg twice a day, and lisinopril 10 mg daily.  Keep off hydralazine and imdur. PCP stopped hydralazine a year ago.    Reinforced the need and importance of daily weights, a low sodium diet, and fluid restriction (less than 2 L a day). Instructed to call the HF clinic if weight increases more than 3 lbs overnight or 5 lbs in a week.  2) HTN: Restarting HF meds as above.   4) Severe OSA:  Continue nightly CPAP.   5) CKD, stage IIIb: Baseline Cr 1.5-1.7. As  above, getting BMET today.  6) Morbid Obesity: needs to lose weight  . Discussed portion control.   Follow up in 4 weeks. Check BMET today.    Amy Clegg NP-C   01/15/2016  Patient seen and examined with Tonye Becket, NP. We discussed all aspects of the encounter. I agree with the assessment and plan as stated above.   He looks pretty good despite being out of meds for a while. Will restart meds as above. Volume status looks good off lasix. He is compliant with CPAP. Need to watch BP closely as I suspect CM may be HTN-induced. Repeat echo ordered.   Bensimhon, Daniel,MD 4:32 PM

## 2016-01-15 NOTE — Patient Instructions (Addendum)
Stop Potassium.   Stop Imdur.  Start Spironolactone 25 mg daily.  Start Lisinopril 10mg  daily.  Start Carvedilol 25 mg twice daily.   Follow up and Echo in 4 weeks with Amy Filbert Schilder

## 2016-01-16 ENCOUNTER — Ambulatory Visit (HOSPITAL_COMMUNITY)
Admission: RE | Admit: 2016-01-16 | Discharge: 2016-01-16 | Disposition: A | Discharge status: Home / Self Care | Payer: 59 | Source: Ambulatory Visit | Attending: Internal Medicine | Admitting: Internal Medicine

## 2016-01-16 DIAGNOSIS — I5022 Chronic systolic (congestive) heart failure: Secondary | ICD-10-CM | POA: Diagnosis not present

## 2016-01-16 LAB — BASIC METABOLIC PANEL
ANION GAP: 11 (ref 5–15)
BUN: 15 mg/dL (ref 6–20)
CALCIUM: 9.1 mg/dL (ref 8.9–10.3)
CHLORIDE: 104 mmol/L (ref 101–111)
CO2: 25 mmol/L (ref 22–32)
Creatinine, Ser: 1.25 mg/dL — ABNORMAL HIGH (ref 0.61–1.24)
GFR calc non Af Amer: >60 mL/min (ref >60)
Glucose, Bld: 106 mg/dL — ABNORMAL HIGH (ref 65–99)
Potassium: 4.3 mmol/L (ref 3.5–5.1)
Sodium: 140 mmol/L (ref 135–145)

## 2016-01-27 ENCOUNTER — Ambulatory Visit (HOSPITAL_COMMUNITY): Payer: 59 | Attending: Cardiovascular Disease

## 2016-01-27 ENCOUNTER — Other Ambulatory Visit: Payer: Self-pay

## 2016-01-27 DIAGNOSIS — I110 Hypertensive heart disease with heart failure: Secondary | ICD-10-CM | POA: Diagnosis not present

## 2016-01-27 DIAGNOSIS — I509 Heart failure, unspecified: Secondary | ICD-10-CM | POA: Diagnosis present

## 2016-01-27 DIAGNOSIS — I5021 Acute systolic (congestive) heart failure: Secondary | ICD-10-CM

## 2016-01-27 DIAGNOSIS — I5022 Chronic systolic (congestive) heart failure: Secondary | ICD-10-CM | POA: Diagnosis not present

## 2016-01-27 DIAGNOSIS — Z6841 Body Mass Index (BMI) 40.0 and over, adult: Secondary | ICD-10-CM | POA: Insufficient documentation

## 2016-01-27 MED ORDER — PERFLUTREN LIPID MICROSPHERE
3.0000 mL | INTRAVENOUS | Status: AC | PRN
  Administered 2016-01-27: 3 mL via INTRAVENOUS

## 2016-02-13 ENCOUNTER — Inpatient Hospital Stay (HOSPITAL_COMMUNITY): Admission: RE | Admit: 2016-02-13 | Payer: 59 | Source: Ambulatory Visit

## 2016-02-20 MED FILL — SPIRONOLACTONE 25 MG TABLET: 25 | 30 days supply | Qty: 30 | Fill #1 | Status: TO

## 2016-02-20 MED FILL — ?LISINOPRIL 10 MG TABLET: 10 | 30 days supply | Qty: 30 | Fill #0

## 2016-02-21 ENCOUNTER — Other Ambulatory Visit (HOSPITAL_COMMUNITY): Payer: Self-pay | Admitting: Internal Medicine

## 2016-02-25 MED FILL — ?CARVEDILOL 25 MG TABLET: 25 | 30 days supply | Qty: 60 | Fill #1 | Status: TO

## 2016-03-14 ENCOUNTER — Encounter (HOSPITAL_COMMUNITY): Payer: Self-pay | Admitting: Emergency Medicine

## 2016-03-14 ENCOUNTER — Ambulatory Visit (HOSPITAL_COMMUNITY)
Admission: EM | Admit: 2016-03-14 | Discharge: 2016-03-14 | Disposition: A | Discharge status: Home / Self Care | Payer: 59 | Attending: Family Medicine | Admitting: Family Medicine

## 2016-03-14 DIAGNOSIS — J302 Other seasonal allergic rhinitis: Secondary | ICD-10-CM

## 2016-03-14 MED ORDER — TRIAMCINOLONE ACETONIDE 40 MG/ML IJ SUSP
60.0000 mg | Freq: Once | INTRAMUSCULAR | Status: AC
  Administered 2016-03-14: 60 mg via INTRAMUSCULAR

## 2016-03-14 MED ORDER — TRIAMCINOLONE ACETONIDE 40 MG/ML IJ SUSP
INTRAMUSCULAR | Status: AC
  Filled 2016-03-14: qty 1

## 2016-03-14 MED ORDER — IPRATROPIUM BROMIDE 0.06 % NA SOLN: 2.0000 | Freq: Four times a day (QID) | NASAL | Status: DC

## 2016-03-14 MED ORDER — PREDNISONE 20 MG PO TABS: ORAL_TABLET | ORAL | Status: DC

## 2016-03-14 NOTE — ED Provider Notes (Signed)
CSN: [id number]     Arrival date & time 03/14/16  1306 History   First MD Initiated Contact with Patient 03/14/16 1338     Chief Complaint  Patient presents with  . URI   (Consider location/radiation/quality/duration/timing/severity/associated sxs/prior Treatment) HPI Comments: 40 year old morbidly obese male with a two-week history of cough, rhinorrhea, nasal stuffiness, PND, sore throat and cough that is worse when supine. One day in the past 2 weeks he had fever which was self-limiting. He said no shortness of breath or earache. No GI symptoms, chest pain or shortness of breath.   Past Medical History  Diagnosis Date  . Hypertension   . Chronic systolic CHF (congestive heart failure) (HCC)     a. Dx 11/2013 - likely due to hypertensive heart disease/NICM (EF 25%, normal cors).  . CKD (chronic kidney disease) stage 3, GFR 30-59 ml/min   . Enlarged lymph node   . Morbid obesity (HCC)   . Sleep apnea    Past Surgical History  Procedure Laterality Date  . Finger surgery    . Left and right heart catheterization with coronary angiogram N/A 12/08/2013    Procedure: LEFT AND RIGHT HEART CATHETERIZATION WITH CORONARY ANGIOGRAM;  Surgeon: Laurey Morale, MD;  Location: [facility] CATH LAB;  Service: Cardiovascular;  Laterality: N/A;   Family History  Problem Relation Age of Onset  . Heart attack Father 20   Social History  Substance Use Topics  . Smoking status: Former Smoker -- 0.50 packs/day for 11 years    Types: Cigarettes    Quit date: 03/23/2012  . Smokeless tobacco: None  . Alcohol Use: No    Review of Systems  Constitutional: Negative for diaphoresis, activity change and fatigue.  HENT: Positive for congestion, postnasal drip, rhinorrhea and sore throat. Negative for ear pain, facial swelling and trouble swallowing.   Eyes: Negative for pain, discharge and redness.  Respiratory: Positive for cough. Negative for chest tightness and shortness of breath.   Cardiovascular: Negative.    Gastrointestinal: Negative.   Musculoskeletal: Negative.  Negative for neck pain and neck stiffness.  Neurological: Negative.     Allergies  Shellfish allergy  Home Medications   Prior to Admission medications   Medication Sig Start Date End Date Taking? Authorizing Provider  carvedilol (COREG) 25 MG tablet Take 1 tablet (25 mg total) by mouth 2 (two) times daily with a meal. 01/15/16  Yes Amy D Clegg, NP  lisinopril (PRINIVIL,ZESTRIL) 10 MG tablet TAKE 1 TABLET BY MOUTH TWICE DAILY 02/21/16  Yes Dolores Patty, MD  spironolactone (ALDACTONE) 25 MG tablet Take 1 tablet (25 mg total) by mouth daily. 01/15/16  Yes Amy D Clegg, NP  ipratropium (ATROVENT) 0.06 % nasal spray Place 2 sprays into both nostrils 4 (four) times daily. 03/14/16   Hayden Rasmussen, NP  lisinopril (PRINIVIL,ZESTRIL) 10 MG tablet Take 1 tablet (10 mg total) by mouth daily. 01/15/16   Amy D Filbert Schilder, NP  Multiple Vitamin (MULTI-VITAMIN DAILY PO) Take by mouth. Reported on 01/15/2016    Historical Provider, MD  predniSONE (DELTASONE) 20 MG tablet Take 3 tabs po on first day, 2 tabs second day, 2 tabs third day, 1 tab fourth day, 1 tab 5th day. Take with food. Start 03/15/2016. 03/14/16   Hayden Rasmussen, NP   Meds Ordered and Administered this Visit   Medications  triamcinolone acetonide (KENALOG-40) injection 60 mg (not administered)    BP 139/86 mmHg  Pulse 67  Temp(Src) 97.9 F (36.6 C) (Oral)  Resp 20  SpO2 95% No data found.   Physical Exam  Constitutional: He is oriented to person, place, and time. He appears well-developed and well-nourished. No distress.  HENT:  Mouth/Throat: No oropharyngeal exudate.  Bilateral TMs are normal Posterior pharynx with minor erythema and moderate amount of clear PND.  Eyes: Conjunctivae and EOM are normal.  Neck: Normal range of motion. Neck supple.  Cardiovascular: Normal rate, regular rhythm and normal heart sounds.   Pulmonary/Chest: Effort normal and breath sounds normal. No  respiratory distress. He has no wheezes. He has no rales.  Musculoskeletal: Normal range of motion. He exhibits no edema.  Lymphadenopathy:    He has no cervical adenopathy.  Neurological: He is alert and oriented to person, place, and time.  Skin: Skin is warm and dry. No rash noted.  Psychiatric: He has a normal mood and affect.    ED Course  Procedures (including critical care time)  Labs Review Labs Reviewed - No data to display  Imaging Review No results found.   Visual Acuity Review  Right Eye Distance:   Left Eye Distance:   Bilateral Distance:    Right Eye Near:   Left Eye Near:    Bilateral Near:         MDM   1. Other seasonal allergic rhinitis    Allergic Rhinitis For nasal and head congestion may take Sudafed PE 10 mg every 4 to 6 hours as needed. This may cause mild increases in blood pressure. Saline nasal spray used frequently. Also use Nasocort or flonase nasal spray as directed for a month. For drainage may use Allegra, Claritin or Zyrtec. If you need stronger medicine to stop drainage may take Chlor-Trimeton 2-4 mg every 4 hours. This may cause drowsiness. Drink plenty of fluids and stay well-hydrated.  Meds ordered this encounter  Medications  . triamcinolone acetonide (KENALOG-40) injection 60 mg    Sig:   . ipratropium (ATROVENT) 0.06 % nasal spray    Sig: Place 2 sprays into both nostrils 4 (four) times daily.    Dispense:  15 mL    Refill:  0    Order Specific Question:  Supervising Provider    Answer:  Linna Hoff [id number]  . predniSONE (DELTASONE) 20 MG tablet    Sig: Take 3 tabs po on first day, 2 tabs second day, 2 tabs third day, 1 tab fourth day, 1 tab 5th day. Take with food. Start 03/15/2016.    Dispense:  9 tablet    Refill:  0    Order Specific Question:  Supervising Provider    Answer:  Linna Hoff [[id number]]       Hayden Rasmussen, NP 03/14/16 1424

## 2016-03-14 NOTE — ED Notes (Signed)
C/o cold sx onset x2 weeks associated w/congestion, runny nose, ST and bilateral yellow d/c from eyes, fevers, and prod cough... A&O x4... No acute distress.

## 2016-03-14 NOTE — Discharge Instructions (Signed)
Allergic Rhinitis For nasal and head congestion may take Sudafed PE 10 mg every 4 to 6 hours as needed. This may cause mild increases in blood pressure. Saline nasal spray used frequently. Also use Nasocort or flonase nasal spray as directed for a month. For drainage may use Allegra, Claritin or Zyrtec. If you need stronger medicine to stop drainage may take Chlor-Trimeton 2-4 mg every 4 hours. This may cause drowsiness. Drink plenty of fluids and stay well-hydrated.  Allergic rhinitis is when the mucous membranes in the nose respond to allergens. Allergens are particles in the air that cause your body to have an allergic reaction. This causes you to release allergic antibodies. Through a chain of events, these eventually cause you to release histamine into the blood stream. Although meant to protect the body, it is this release of histamine that causes your discomfort, such as frequent sneezing, congestion, and an itchy, runny nose.  CAUSES Seasonal allergic rhinitis (hay fever) is caused by pollen allergens that may come from grasses, trees, and weeds. Year-round allergic rhinitis (perennial allergic rhinitis) is caused by allergens such as house dust mites, pet dander, and mold spores. SYMPTOMS  Nasal stuffiness (congestion).  Itchy, runny nose with sneezing and tearing of the eyes. DIAGNOSIS Your health care provider can help you determine the allergen or allergens that trigger your symptoms. If you and your health care provider are unable to determine the allergen, skin or blood testing may be used. Your health care provider will diagnose your condition after taking your health history and performing a physical exam. Your health care provider may assess you for other related conditions, such as asthma, pink eye, or an ear infection. TREATMENT Allergic rhinitis does not have a cure, but it can be controlled by:  Medicines that block allergy symptoms. These may include allergy shots, nasal  sprays, and oral antihistamines.  Avoiding the allergen. Hay fever may often be treated with antihistamines in pill or nasal spray forms. Antihistamines block the effects of histamine. There are over-the-counter medicines that may help with nasal congestion and swelling around the eyes. Check with your health care provider before taking or giving this medicine. If avoiding the allergen or the medicine prescribed do not work, there are many new medicines your health care provider can prescribe. Stronger medicine may be used if initial measures are ineffective. Desensitizing injections can be used if medicine and avoidance does not work. Desensitization is when a patient is given ongoing shots until the body becomes less sensitive to the allergen. Make sure you follow up with your health care provider if problems continue. HOME CARE INSTRUCTIONS It is not possible to completely avoid allergens, but you can reduce your symptoms by taking steps to limit your exposure to them. It helps to know exactly what you are allergic to so that you can avoid your specific triggers. SEEK MEDICAL CARE IF:  You have a fever.  You develop a cough that does not stop easily (persistent).  You have shortness of breath.  You start wheezing.  Symptoms interfere with normal daily activities.   This information is not intended to replace advice given to you by your health care provider. Make sure you discuss any questions you have with your health care provider.   Document Released: 08/18/2001 Document Revised: 12/14/2014 Document Reviewed: 07/31/2013 Elsevier Interactive Patient Education [employer]

## 2016-04-02 MED FILL — LISINOPRIL 10 MG TABLET: 10 | 30 days supply | Qty: 30 | Fill #1

## 2016-04-02 MED FILL — SPIRONOLACTONE 25 MG TABLET: 25 | 30 days supply | Qty: 30 | Fill #2 | Status: TO

## 2016-04-02 MED FILL — CARVEDILOL 25 MG TABLET: 25 | 30 days supply | Qty: 60 | Fill #2 | Status: TO

## 2016-05-07 MED FILL — LISINOPRIL 10 MG TABLET: 10 | 30 days supply | Qty: 30 | Fill #2

## 2016-05-07 MED FILL — CARVEDILOL 25 MG TABLET: 25 | 30 days supply | Qty: 60 | Fill #3 | Status: TO

## 2016-05-07 MED FILL — ?SPIRONOLACTONE 25 MG TABLE: 25 | 30 days supply | Qty: 30 | Fill #3 | Status: TO

## 2016-06-12 ENCOUNTER — Other Ambulatory Visit (HOSPITAL_COMMUNITY): Payer: Self-pay | Admitting: Internal Medicine

## 2016-06-24 MED FILL — ?SPIRONOLACTONE 25 MG TABLE: 25 | 30 days supply | Qty: 30 | Fill #4 | Status: TO

## 2016-06-24 MED FILL — ?LISINOPRIL 10 MG TABLET: 10 | 30 days supply | Qty: 60 | Fill #0

## 2016-06-24 MED FILL — ?CARVEDILOL 25 MG TABLET: 25 | 30 days supply | Qty: 60 | Fill #0

## 2016-07-28 MED FILL — SPIRONOLACTONE 25 MG TABLET: 25 | 30 days supply | Qty: 30 | Fill #5 | Status: TO

## 2016-07-28 MED FILL — ?CARVEDILOL 25 MG TABLET: 25 | 30 days supply | Qty: 60 | Fill #0

## 2016-07-28 MED FILL — LISINOPRIL 10 MG TABLET: 10 | 30 days supply | Qty: 60 | Fill #1

## 2016-08-06 ENCOUNTER — Ambulatory Visit (HOSPITAL_COMMUNITY)
Admission: RE | Admit: 2016-08-06 | Discharge: 2016-08-06 | Disposition: A | Discharge status: Home / Self Care | Payer: 59 | Source: Ambulatory Visit | Attending: Internal Medicine | Admitting: Internal Medicine

## 2016-08-06 ENCOUNTER — Telehealth (HOSPITAL_COMMUNITY): Payer: Self-pay | Admitting: *Deleted

## 2016-08-06 VITALS — BP 148/88 | HR 68 | Wt 349.5 lb

## 2016-08-06 DIAGNOSIS — Z01810 Encounter for preprocedural cardiovascular examination: Secondary | ICD-10-CM

## 2016-08-06 DIAGNOSIS — Z6841 Body Mass Index (BMI) 40.0 and over, adult: Secondary | ICD-10-CM | POA: Insufficient documentation

## 2016-08-06 DIAGNOSIS — I428 Other cardiomyopathies: Secondary | ICD-10-CM | POA: Insufficient documentation

## 2016-08-06 DIAGNOSIS — I10 Essential (primary) hypertension: Secondary | ICD-10-CM | POA: Diagnosis not present

## 2016-08-06 DIAGNOSIS — N183 Chronic kidney disease, stage 3 (moderate): Secondary | ICD-10-CM | POA: Diagnosis not present

## 2016-08-06 DIAGNOSIS — I5022 Chronic systolic (congestive) heart failure: Secondary | ICD-10-CM

## 2016-08-06 DIAGNOSIS — G4733 Obstructive sleep apnea (adult) (pediatric): Secondary | ICD-10-CM | POA: Insufficient documentation

## 2016-08-06 DIAGNOSIS — I130 Hypertensive heart and chronic kidney disease with heart failure and stage 1 through stage 4 chronic kidney disease, or unspecified chronic kidney disease: Secondary | ICD-10-CM | POA: Insufficient documentation

## 2016-08-06 DIAGNOSIS — Z79899 Other long term (current) drug therapy: Secondary | ICD-10-CM | POA: Diagnosis not present

## 2016-08-06 MED ORDER — LISINOPRIL 20 MG PO TABS: 20.0000 mg | ORAL_TABLET | Freq: Two times a day (BID) | ORAL | 6 refills | Status: DC

## 2016-08-06 NOTE — Progress Notes (Signed)
Patient ID: Bradley Wilson, male   DOB: 02/04/1976, 40 y.o.   MRN: [id number]   Cardiologist: Dr. Shirlee LatchMcLean PCP: Dr Christain SacramentoBeaver   HPI: [name] is a 40 y/o with history of HTN, CKD, morbid obesity, OSA and chronic systolic HF.  Nonischemic cardiomyopathy, had LHC 12/08/13.  Admitted to the hospital 12/05/13 with SOB and weight gain. ECHO showed EF 25%, grade II DD and reduced RV systolic fx. LHC showed no CAD. HIV negative. Discharge weight 330 lbs.   He returns for follow up. Feels "really good". Working as a Location managermachine operator. Denies edema/SOB/PND/Orthopnea. Meds stable. Using CPAP every night. Taking meds regularly. SBP ~ 150. Pending sinus surgery.   ECHO 2015 EF 55% moderate LVH, moderate diastolic dysfunction, and normal RV size and systolic function.  Echo 01/2016: EF 50-5% RV normal. Grade II DD  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   ROS: All systems negative except as listed in HPI, PMH and Problem List.  Past Medical History:  Diagnosis Date  . Chronic systolic CHF (congestive heart failure) (HCC)    a. Dx 11/2013 - likely due to hypertensive heart disease/NICM (EF 25%, normal cors).  . CKD (chronic kidney disease) stage 3, GFR 30-59 ml/min   . Enlarged lymph node   . Hypertension   . Morbid obesity (HCC)   . Sleep apnea     Current Outpatient Prescriptions  Medication Sig Dispense Refill  . carvedilol (COREG) 25 MG tablet TAKE 1 TABLET BY MOUTH TWICE DAILY WITH MEALS 60 tablet 0  . ipratropium (ATROVENT) 0.06 % nasal spray Place 2 sprays into both nostrils 4 (four) times daily. 15 mL 0  . lisinopril (PRINIVIL,ZESTRIL) 10 MG tablet TAKE 1 TABLET BY MOUTH TWICE DAILY 60 tablet 3  . Multiple Vitamin (MULTI-VITAMIN DAILY PO) Take by mouth. Reported on 01/15/2016    . spironolactone (ALDACTONE) 25 MG tablet Take 1 tablet (25 mg total)  by mouth daily. 30 tablet 6   No current facility-administered medications for this encounter.     Vitals:   08/06/16 1504  BP: (!) 148/88  BP Location: Left Arm  Patient Position: Sitting  Cuff Size: Large  Pulse: 68  SpO2: 98%  Weight: (!) 349 lb 8 oz (158.5 kg)    PHYSICAL EXAM: General:  Well appearing. No resp difficulty; obese HEENT: normal Neck: Thick. JVP difficult to assess d/t body habitus but does not appear elevated. Carotids 2+ bilaterally; no bruits. No lymphadenopathy or thryomegaly appreciated. Cor: PMI normal. Regular rate & rhythm. No rubs, gallops or murmurs. Lungs: clear Abdomen: Obese, soft, nontender, nondistended. No hepatosplenomegaly. No bruits or masses. Good bowel sounds. Extremities: no cyanosis, clubbing, rash, no lower extremity edema Neuro: alert & orientedx3, cranial nerves grossly intact. Moves all 4 extremities w/o difficulty. Affect pleasant.   ASSESSMENT & PLAN:  1) Chronic systolic HF, recovered: NICM, EF 50-55% 01/2016.  - Doing well. NYHA I-II. Volume status stable. - Continue 25 mg spiro daily, carvedilol 25 mg twice a day, and increase lisinopril 20 mg bid   Reinforced the need and importance of daily weights, a low sodium diet, and fluid restriction (less than 2 L a day). Instructed to call the HF clinic if weight increases more than 3 lbs overnight or 5 lbs in a week.  2) HTN:  -- Elevated. Increase lisinopril to 20 bid. Can  add hydralazine as needed.  4) Severe OSA:  Continue nightly CPAP.   5) CKD, stage IIIb: Baseline Cr 1.5-1.7. Creatinine in 2/17 1.25 6) Morbid Obesity: needs to lose weight  . 7) Pre-op CV exam:  -From cardiac point of view he is at low risk for peri-operative cardiac complications and can proceed with surgery without any further cardiac work-up. I suspect he may have a difficult airway and anesthesia will obviously need to manage that closely.    Arvilla Meres MD 08/06/2016

## 2016-08-06 NOTE — Addendum Note (Signed)
Encounter addended by: Noralee Space, RN on: 08/06/2016  3:39 PM<BR>    Actions taken: Order Entry activity accessed, Sign clinical note

## 2016-08-06 NOTE — Patient Instructions (Signed)
Increase Lisinopril to 20 mg Twice daily   We will contact you in 6 months to schedule your next appointment.

## 2016-08-06 NOTE — Telephone Encounter (Signed)
Today's ov note faxed to [phone] atten: Mrs. Bogar for pt's sinus surgery

## 2016-08-18 MED FILL — ?LISINOPRIL 20 MG TABLET: 20 | 30 days supply | Qty: 60 | Fill #0 | Status: TO

## 2016-09-03 ENCOUNTER — Other Ambulatory Visit (HOSPITAL_COMMUNITY): Payer: Self-pay | Admitting: Pharmacist

## 2016-09-03 MED ORDER — LISINOPRIL 40 MG PO TABS: 20.0000 mg | ORAL_TABLET | Freq: Two times a day (BID) | ORAL | 3 refills | Status: DC

## 2016-09-09 ENCOUNTER — Other Ambulatory Visit (HOSPITAL_COMMUNITY): Payer: Self-pay | Admitting: *Deleted

## 2016-09-09 MED ORDER — CARVEDILOL 25 MG PO TABS: 25.0000 mg | ORAL_TABLET | Freq: Two times a day (BID) | ORAL | 0 refills | Status: DC

## 2016-09-25 MED FILL — LISINOPRIL 20 MG TABLET: 20 | 30 days supply | Qty: 60 | Fill #0

## 2016-09-30 MED FILL — CARVEDILOL 25 MG TABLET: 25 | 30 days supply | Qty: 60 | Fill #0

## 2016-10-22 ENCOUNTER — Other Ambulatory Visit (HOSPITAL_COMMUNITY): Payer: Self-pay | Admitting: Adult Health

## 2016-10-22 DIAGNOSIS — I5022 Chronic systolic (congestive) heart failure: Secondary | ICD-10-CM

## 2016-11-03 ENCOUNTER — Other Ambulatory Visit (HOSPITAL_COMMUNITY): Payer: Self-pay | Admitting: *Deleted

## 2016-11-03 DIAGNOSIS — I5022 Chronic systolic (congestive) heart failure: Secondary | ICD-10-CM

## 2016-11-03 MED ORDER — LISINOPRIL 20 MG PO TABS: 20.0000 mg | ORAL_TABLET | Freq: Two times a day (BID) | ORAL | 1 refills | Status: DC

## 2016-11-03 MED ORDER — SPIRONOLACTONE 25 MG PO TABS: 25.0000 mg | ORAL_TABLET | Freq: Every day | ORAL | 1 refills | Status: DC

## 2016-11-04 ENCOUNTER — Other Ambulatory Visit (HOSPITAL_COMMUNITY): Payer: Self-pay | Admitting: *Deleted

## 2016-11-16 ENCOUNTER — Other Ambulatory Visit (HOSPITAL_COMMUNITY): Payer: Self-pay | Admitting: *Deleted

## 2016-11-16 DIAGNOSIS — I5022 Chronic systolic (congestive) heart failure: Secondary | ICD-10-CM

## 2016-11-16 MED ORDER — SPIRONOLACTONE 25 MG PO TABS: 25.0000 mg | ORAL_TABLET | Freq: Every day | ORAL | 1 refills | Status: DC

## 2016-12-02 ENCOUNTER — Other Ambulatory Visit (HOSPITAL_COMMUNITY): Payer: Self-pay | Admitting: Internal Medicine

## 2016-12-23 ENCOUNTER — Other Ambulatory Visit (HOSPITAL_COMMUNITY): Payer: Self-pay | Admitting: Pharmacist

## 2016-12-23 MED ORDER — CARVEDILOL 25 MG PO TABS: 25.0000 mg | ORAL_TABLET | Freq: Two times a day (BID) | ORAL | 5 refills | Status: DC

## 2016-12-25 ENCOUNTER — Other Ambulatory Visit (HOSPITAL_COMMUNITY): Payer: Self-pay | Admitting: *Deleted

## 2016-12-25 MED ORDER — CARVEDILOL 25 MG PO TABS
25.0000 mg | ORAL_TABLET | Freq: Two times a day (BID) | ORAL | 5 refills | Status: DC
Start: 2016-12-25 — End: 2017-05-31

## 2017-04-30 ENCOUNTER — Other Ambulatory Visit (HOSPITAL_COMMUNITY): Payer: Self-pay | Admitting: Cardiology

## 2017-04-30 MED ORDER — LISINOPRIL 20 MG PO TABS
20.0000 mg | ORAL_TABLET | Freq: Two times a day (BID) | ORAL | 0 refills | Status: DC
Start: 2017-04-30 — End: 2017-05-07

## 2017-05-07 ENCOUNTER — Other Ambulatory Visit (HOSPITAL_COMMUNITY): Payer: Self-pay | Admitting: Cardiology

## 2017-05-07 MED ORDER — LISINOPRIL 20 MG PO TABS: 20.0000 mg | ORAL_TABLET | Freq: Two times a day (BID) | ORAL | 0 refills | Status: DC

## 2017-05-19 ENCOUNTER — Other Ambulatory Visit (HOSPITAL_COMMUNITY): Payer: Self-pay | Admitting: Cardiology

## 2017-05-19 DIAGNOSIS — I5022 Chronic systolic (congestive) heart failure: Secondary | ICD-10-CM

## 2017-05-19 MED ORDER — SPIRONOLACTONE 25 MG PO TABS: 25.0000 mg | ORAL_TABLET | Freq: Every day | ORAL | 0 refills | Status: DC

## 2017-05-20 ENCOUNTER — Other Ambulatory Visit (HOSPITAL_COMMUNITY): Payer: Self-pay

## 2017-05-20 DIAGNOSIS — I5022 Chronic systolic (congestive) heart failure: Secondary | ICD-10-CM

## 2017-05-20 MED ORDER — SPIRONOLACTONE 25 MG PO TABS: 25.0000 mg | ORAL_TABLET | Freq: Every day | ORAL | 0 refills | Status: DC

## 2017-05-31 ENCOUNTER — Ambulatory Visit (HOSPITAL_COMMUNITY)
Admission: RE | Admit: 2017-05-31 | Discharge: 2017-05-31 | Disposition: A | Discharge status: Home / Self Care | Payer: 59 | Source: Ambulatory Visit | Attending: Cardiology | Admitting: Cardiology

## 2017-05-31 VITALS — BP 146/112 | HR 59 | Wt 339.6 lb

## 2017-05-31 DIAGNOSIS — I159 Secondary hypertension, unspecified: Secondary | ICD-10-CM | POA: Diagnosis not present

## 2017-05-31 DIAGNOSIS — I5022 Chronic systolic (congestive) heart failure: Secondary | ICD-10-CM | POA: Insufficient documentation

## 2017-05-31 DIAGNOSIS — I10 Essential (primary) hypertension: Secondary | ICD-10-CM | POA: Insufficient documentation

## 2017-05-31 DIAGNOSIS — I130 Hypertensive heart and chronic kidney disease with heart failure and stage 1 through stage 4 chronic kidney disease, or unspecified chronic kidney disease: Secondary | ICD-10-CM | POA: Insufficient documentation

## 2017-05-31 DIAGNOSIS — I428 Other cardiomyopathies: Secondary | ICD-10-CM | POA: Diagnosis not present

## 2017-05-31 DIAGNOSIS — Z6841 Body Mass Index (BMI) 40.0 and over, adult: Secondary | ICD-10-CM | POA: Diagnosis not present

## 2017-05-31 DIAGNOSIS — E669 Obesity, unspecified: Secondary | ICD-10-CM

## 2017-05-31 DIAGNOSIS — G4733 Obstructive sleep apnea (adult) (pediatric): Secondary | ICD-10-CM | POA: Insufficient documentation

## 2017-05-31 DIAGNOSIS — Z79899 Other long term (current) drug therapy: Secondary | ICD-10-CM | POA: Insufficient documentation

## 2017-05-31 DIAGNOSIS — N183 Chronic kidney disease, stage 3 unspecified: Secondary | ICD-10-CM

## 2017-05-31 LAB — BASIC METABOLIC PANEL
ANION GAP: 7 (ref 5–15)
BUN: 15 mg/dL (ref 6–20)
CHLORIDE: 106 mmol/L (ref 101–111)
CO2: 27 mmol/L (ref 22–32)
Calcium: 9 mg/dL (ref 8.9–10.3)
Creatinine, Ser: 1.26 mg/dL — ABNORMAL HIGH (ref 0.61–1.24)
GFR calc Af Amer: >60 mL/min (ref >60)
GLUCOSE: 93 mg/dL (ref 65–99)
POTASSIUM: 3.9 mmol/L (ref 3.5–5.1)
Sodium: 140 mmol/L (ref 135–145)

## 2017-05-31 MED ORDER — CARVEDILOL 25 MG PO TABS: 25.0000 mg | ORAL_TABLET | Freq: Two times a day (BID) | ORAL | 6 refills | Status: DC

## 2017-05-31 NOTE — Progress Notes (Signed)
Advanced Heart Failure Medication Review by a Pharmacist  Does the patient  feel that his/her medications are working for him/her?  yes  Has the patient been experiencing any side effects to the medications prescribed?  no  Does the patient measure his/her own blood pressure or blood glucose at home?  no   Does the patient have any problems obtaining medications due to transportation or finances?   no  Understanding of regimen: fair Understanding of indications: fair Potential of compliance: fair Patient understands to avoid NSAIDs. Patient understands to avoid decongestants.  Issues to address at subsequent visits: Compliance   Pharmacist comments: Mr. Navejas is a pleasant 41 yo M presenting without a medication list but with fair understanding of his regimen. He reports good compliance but did state that he has only been taking carvedilol once a day since he thought that was how it was prescribed. He did not have any other medication-related questions or concerns for me at this time.   Tyler Deis. Bonnye Fava, PharmD, BCPS, CPP Clinical Pharmacist Pager: [phone] Phone: [phone] 05/31/2017 10:11 AM      Time with patient: 8 minutes Preparation and documentation time: 2 minutes Total time: 10 minutes

## 2017-05-31 NOTE — Patient Instructions (Signed)
Routine lab work today. Will notify you of abnormal results, otherwise no news is good news!  INCREASE Carvedilol (Coreg) to 25 mg (1 tablet) TWICE DAILY.  Follow up 6 months with Dr. Shirlee Latch. We will call you closer to this time, or you may call our office to schedule 1 month before you are due to be seen. Take all medication as prescribed the day of your appointment. Bring all medications with you to your appointment.  Do the following things EVERYDAY: 1) Weigh yourself in the morning before breakfast. Write it down and keep it in a log. 2) Take your medicines as prescribed 3) Eat low salt foods-Limit salt (sodium) to 2000 mg per day.  4) Stay as active as you can everyday 5) Limit all fluids for the day to less than 2 liters

## 2017-05-31 NOTE — Progress Notes (Signed)
Patient ID: Bradley Wilson, male   DOB: Feb 27, 1976, 41 y.o.   MRN: [id number]   Cardiologist: Dr. Shirlee Latch PCP: Dr Christain Sacramento   HPI: [name] is a 41 y/o with history of HTN, CKD, morbid obesity, OSA and chronic systolic HF.  Nonischemic cardiomyopathy, had LHC 12/08/13.  Admitted to the hospital 12/05/13 with SOB and weight gain. ECHO showed EF 25%, grade II DD and reduced RV systolic fx. LHC showed no CAD. HIV negative. Discharge weight 330 lbs.   He presents today for regular follow up. At last visit lisinopril increase.  Weight down 10 lbs from that visit. Has been taking coreg once daily, otherwise, taking medications as directed. Wears CPAP nightly.  Denies SOB/PND/Orthopnea. Can walk for miles without dyspnea or fatigue. No problem with steps.  Works [employer] as a Location manager.   ECHO 2015 EF 55% moderate LVH, moderate diastolic dysfunction, and normal RV size and systolic function.  Echo 01/2016: EF 50-55% RV normal. Grade II DD  01/18/14 K 4.3 Creatinine 1.56 01/25/14 K 4.8 Creatinine 1.42 03/23/2014: K 4.2 Creatinine 1.5  06/03/2015: K 4.1 Creatinine 1.14   SH: Currently working as a Location manager for [employer] Lives at home with Wife and 3 children.    Review of systems complete and found to be negative unless listed in HPI.    Past Medical History:  Diagnosis Date  . Chronic systolic CHF (congestive heart failure) (HCC)    a. Dx 11/2013 - likely due to hypertensive heart disease/NICM (EF 25%, normal cors).  . CKD (chronic kidney disease) stage 3, GFR 30-59 ml/min   . Enlarged lymph node   . Hypertension   . Morbid obesity (HCC)   . Sleep apnea     Current Outpatient Prescriptions  Medication Sig Dispense Refill  . carvedilol (COREG) 25 MG tablet Take 25 mg by mouth daily.    Marland Kitchen lisinopril (PRINIVIL,ZESTRIL) 40 MG tablet Take 20 mg by mouth 2 (two) times daily.    . Multiple Vitamin (MULTI-VITAMIN DAILY PO) Take 1 tablet by mouth daily. Reported on 01/15/2016    . spironolactone  (ALDACTONE) 25 MG tablet Take 1 tablet (25 mg total) by mouth daily. Call CHF clinic for appointment [phone] 90 tablet 0   No current facility-administered medications for this encounter.     Vitals:   05/31/17 1007  BP: (!) 146/112  Pulse: (!) 59  SpO2: 97%  Weight: (!) 339 lb 9.6 oz (154 kg)   Wt Readings from Last 3 Encounters:  05/31/17 (!) 339 lb 9.6 oz (154 kg)  08/06/16 (!) 349 lb 8 oz (158.5 kg)  01/15/16 (!) 358 lb 8 oz (162.6 kg)     PHYSICAL EXAM: General: Well appearing. No resp difficulty. HEENT: Normal Neck: Supple. JVP difficult with body habitus but does not appear elevated. Carotids 2+ bilat; no bruits. No thyromegaly or nodule noted. Cor: PMI nondisplaced. RRR, No M/G/R noted Lungs: CTAB, normal effort. Abdomen: Obese, soft, non-tender, non-distended, no HSM. No bruits or masses. +BS  Extremities: No cyanosis, clubbing, rash, R and LLE no edema.  Neuro: Alert & orientedx3, cranial nerves grossly intact. moves all 4 extremities w/o difficulty. Affect pleasant   ASSESSMENT & PLAN:  1) Chronic systolic HF, recovered: NICM, EF 50-55% 01/2016 -(Had been as low as 25% in 2014) - NYHA I-II. Doing well overall.  - Volume status stable on exam.  - Continue spiro 25 mg daily. - Increase coreg to 25 mg BID. Had been taking only once daily.  -  Continue lisinopril 20 mg BID.  - Reinforced fluid restriction to < 2 L daily, sodium restriction to less than 2000 mg daily, and the importance of daily weights.   2) HTN:  -. Mildly elevated in clinic today. Meds as above.  - Prefers to avoid additional med for now. Increase coreg and follow BPs at work. Instructed to let us know if BPs remain > 140. 4) Severe OSA:   - Continue nightly CPAP.  5) CKD, stage IIIb:  - Baseline Cr 1.5-1.7.  - BMET today.  6) Morbid Obesity:  - Encouraged to lose weight.  - Encouraged to diet and exercise. Currently eating greens, rice, and 4-6 oz of meat for 2 meals a day. Suggest [employer] for meal variance to increase compliance.   Doing well overall. Meds as above. Labs today. Will discuss with MD graduating from HF clinic.   Graciella Freer, PA-C  05/31/2017  Greater than 50% of the 25 minute visit was spent in counseling/coordination of care regarding diease state education, salt/fluid restriction, medication reconciliation, and discussion of medical regimen with on-site Pharm-D.

## 2017-06-10 ENCOUNTER — Other Ambulatory Visit (HOSPITAL_COMMUNITY): Payer: Self-pay | Admitting: Internal Medicine

## 2017-10-14 ENCOUNTER — Other Ambulatory Visit (HOSPITAL_COMMUNITY): Payer: Self-pay | Admitting: Internal Medicine

## 2018-01-18 ENCOUNTER — Other Ambulatory Visit (HOSPITAL_COMMUNITY): Payer: Self-pay | Admitting: Internal Medicine

## 2018-01-21 ENCOUNTER — Telehealth (HOSPITAL_COMMUNITY): Payer: Self-pay | Admitting: Pharmacist

## 2018-01-21 MED ORDER — LISINOPRIL 40 MG PO TABS: 20.0000 mg | ORAL_TABLET | Freq: Two times a day (BID) | ORAL | 5 refills | Status: DC

## 2018-01-21 NOTE — Telephone Encounter (Signed)
[facility] will not cover lisinopril #2 tablets per day. Will switch to 40 mg tablets (1/2 tablet twice daily).   Tyler Deis. Bonnye Fava, PharmD, BCPS, CPP Clinical Pharmacist Phone: [phone] 01/21/2018 4:44 PM

## 2018-01-30 ENCOUNTER — Other Ambulatory Visit: Payer: Self-pay | Admitting: Cardiology

## 2018-01-30 DIAGNOSIS — I5022 Chronic systolic (congestive) heart failure: Secondary | ICD-10-CM

## 2018-03-25 ENCOUNTER — Other Ambulatory Visit (HOSPITAL_COMMUNITY): Payer: Self-pay | Admitting: *Deleted

## 2018-03-25 DIAGNOSIS — I5022 Chronic systolic (congestive) heart failure: Secondary | ICD-10-CM

## 2018-03-25 MED ORDER — SPIRONOLACTONE 25 MG PO TABS: 25.0000 mg | ORAL_TABLET | Freq: Every day | ORAL | 0 refills | Status: DC

## 2018-03-25 NOTE — Telephone Encounter (Signed)
Pt is past due for appt, message sent to schedulers to call pt for an appt.,  message also attached to spiro refill that he needs appt

## 2018-03-29 ENCOUNTER — Telehealth (HOSPITAL_COMMUNITY): Payer: Self-pay | Admitting: Cardiology

## 2018-03-29 NOTE — Telephone Encounter (Signed)
Per staff message from Meredith Staggers, RN, called patient to schedule f/u with APP side, pt last seen 05/2017.  Pt states he is now living in [place], [place].  I transferred patient to Teresa Coombs, RN to see if she has names of any MDs in that area that Dr. Shirlee Latch refers patients to.

## 2018-05-02 ENCOUNTER — Other Ambulatory Visit (HOSPITAL_COMMUNITY): Payer: Self-pay | Admitting: Student

## 2018-05-10 ENCOUNTER — Other Ambulatory Visit (HOSPITAL_COMMUNITY): Payer: Self-pay | Admitting: Cardiology

## 2018-05-25 ENCOUNTER — Other Ambulatory Visit (HOSPITAL_COMMUNITY): Payer: Self-pay | Admitting: Internal Medicine

## 2018-07-10 ENCOUNTER — Other Ambulatory Visit (HOSPITAL_COMMUNITY): Payer: Self-pay | Admitting: Internal Medicine

## 2018-07-10 DIAGNOSIS — I5022 Chronic systolic (congestive) heart failure: Secondary | ICD-10-CM

## 2018-07-15 ENCOUNTER — Other Ambulatory Visit (HOSPITAL_COMMUNITY): Payer: Self-pay | Admitting: Internal Medicine

## 2018-07-15 DIAGNOSIS — I5022 Chronic systolic (congestive) heart failure: Secondary | ICD-10-CM

## 2018-08-15 ENCOUNTER — Other Ambulatory Visit (HOSPITAL_COMMUNITY): Payer: Self-pay | Admitting: Cardiology

## 2018-10-08 ENCOUNTER — Other Ambulatory Visit (HOSPITAL_COMMUNITY): Payer: Self-pay | Admitting: Internal Medicine

## 2019-01-01 ENCOUNTER — Other Ambulatory Visit (HOSPITAL_COMMUNITY): Payer: Self-pay | Admitting: Internal Medicine

## 2019-01-01 DIAGNOSIS — I5022 Chronic systolic (congestive) heart failure: Secondary | ICD-10-CM

## 2019-01-25 ENCOUNTER — Other Ambulatory Visit (HOSPITAL_COMMUNITY): Payer: Self-pay | Admitting: Cardiology

## 2022-10-23 ENCOUNTER — Other Ambulatory Visit: Payer: Self-pay

## 2022-10-23 ENCOUNTER — Emergency Department (HOSPITAL_COMMUNITY)
Admission: EM | Admit: 2022-10-23 | Discharge: 2022-10-24 | Disposition: A | Discharge status: Home / Self Care | Payer: Medicaid Other | Attending: Emergency Medicine | Admitting: Emergency Medicine

## 2022-10-23 ENCOUNTER — Emergency Department (HOSPITAL_COMMUNITY): Payer: Medicaid Other

## 2022-10-23 ENCOUNTER — Encounter (HOSPITAL_COMMUNITY): Payer: Self-pay

## 2022-10-23 DIAGNOSIS — Z79899 Other long term (current) drug therapy: Secondary | ICD-10-CM | POA: Insufficient documentation

## 2022-10-23 DIAGNOSIS — I130 Hypertensive heart and chronic kidney disease with heart failure and stage 1 through stage 4 chronic kidney disease, or unspecified chronic kidney disease: Secondary | ICD-10-CM | POA: Insufficient documentation

## 2022-10-23 DIAGNOSIS — I509 Heart failure, unspecified: Secondary | ICD-10-CM | POA: Insufficient documentation

## 2022-10-23 DIAGNOSIS — N189 Chronic kidney disease, unspecified: Secondary | ICD-10-CM | POA: Insufficient documentation

## 2022-10-23 DIAGNOSIS — R42 Dizziness and giddiness: Secondary | ICD-10-CM

## 2022-10-23 LAB — CBC WITH DIFFERENTIAL/PLATELET
Abs Immature Granulocytes: 0.03 10*3/uL (ref 0.00–0.07)
Basophils Absolute: 0.1 10*3/uL (ref 0.0–0.1)
Basophils Relative: 1 %
Eosinophils Absolute: 0.2 10*3/uL (ref 0.0–0.5)
Eosinophils Relative: 2 %
HCT: 39.3 % (ref 39.0–52.0)
Hemoglobin: 13.1 g/dL (ref 13.0–17.0)
Immature Granulocytes: 0 %
Lymphocytes Relative: 43 %
Lymphs Abs: 2.9 10*3/uL (ref 0.7–4.0)
MCH: 29.2 pg (ref 26.0–34.0)
MCHC: 33.3 g/dL (ref 30.0–36.0)
MCV: 87.5 fL (ref 80.0–100.0)
Monocytes Absolute: 0.5 10*3/uL (ref 0.1–1.0)
Monocytes Relative: 7 %
Neutro Abs: 3.1 10*3/uL (ref 1.7–7.7)
Neutrophils Relative %: 47 %
Platelets: 255 10*3/uL (ref 150–400)
RBC: 4.49 MIL/uL (ref 4.22–5.81)
RDW: 12.2 % (ref 11.5–15.5)
WBC: 6.8 10*3/uL (ref 4.0–10.5)
nRBC: 0 % (ref 0.0–0.2)

## 2022-10-23 LAB — URINALYSIS, ROUTINE W REFLEX MICROSCOPIC
Bilirubin Urine: NEGATIVE
Glucose, UA: NEGATIVE mg/dL
Hgb urine dipstick: NEGATIVE
Ketones, ur: NEGATIVE mg/dL
Leukocytes,Ua: NEGATIVE
Nitrite: NEGATIVE
Protein, ur: NEGATIVE mg/dL
Specific Gravity, Urine: 1.029 (ref 1.005–1.030)
pH: 5 (ref 5.0–8.0)

## 2022-10-23 LAB — BASIC METABOLIC PANEL
Anion gap: 12 (ref 5–15)
BUN: 18 mg/dL (ref 6–20)
CO2: 25 mmol/L (ref 22–32)
Calcium: 9.4 mg/dL (ref 8.9–10.3)
Chloride: 105 mmol/L (ref 98–111)
Creatinine, Ser: 1.38 mg/dL — ABNORMAL HIGH (ref 0.61–1.24)
GFR, Estimated: >60 mL/min (ref >60)
Glucose, Bld: 93 mg/dL (ref 70–99)
Potassium: 4.3 mmol/L (ref 3.5–5.1)
Sodium: 142 mmol/L (ref 135–145)

## 2022-10-23 LAB — TROPONIN I (HIGH SENSITIVITY): Troponin I (High Sensitivity): 49 ng/L — ABNORMAL HIGH (ref <18)

## 2022-10-23 NOTE — ED Triage Notes (Signed)
Pt reports dizziness that started about one hour PTA (2100) when he was driving associated with numbness to bilateral hands. The dizziness is relieved when he takes a deep breath. Denies n/v/loss of consciousness. Denies chest pain.

## 2022-10-23 NOTE — ED Provider Triage Note (Signed)
Emergency Medicine Provider Triage Evaluation Note  Bradley Wilson , a 46 y.o. male  was evaluated in triage.  Pt complains of dizziness/lightheadedness.  States it has been happening recently.  States he starts to feel flushed in his face, spots in vision, hands tingly, and like he may pass out.  He has not lost consciousness.  He reports it gets better when he takes several deep breaths in a row.  Denies chest pain, SOB, cough, fever.  Review of Systems  Positive: Dizziness, lightheadedness Negative: fever  Physical Exam  BP (!) 148/99   Pulse 68   Temp 99.3 F (37.4 C)   Resp 18   Ht 6' (1.829 m)   Wt (!) 142.9 kg   SpO2 94%   BMI 42.72 kg/m  Gen:   Awake, no distress   Resp:  Normal effort  MSK:   Moves extremities without difficulty  Other:  AAOx3, no focal deficits, speech clear  Medical Decision Making  Medically screening exam initiated at 10:20 PM.  Appropriate orders placed.  Martie Lee was informed that the remainder of the evaluation will be completed by another provider, this initial triage assessment does not replace that evaluation, and the importance of remaining in the ED until their evaluation is complete.  Dizziness/lightheadedness.  Almost sounds like near syncope given prodrome of flushed face, spots in vision, etc.  No focal deficits in triage.  Does not meet code stroke criteria.  Will proceed with EKG, labs, CXR.   Garlon Hatchet, PA-C 10/23/22 2222

## 2022-10-24 ENCOUNTER — Other Ambulatory Visit: Payer: Self-pay

## 2022-10-24 ENCOUNTER — Encounter (HOSPITAL_BASED_OUTPATIENT_CLINIC_OR_DEPARTMENT_OTHER): Payer: Self-pay | Admitting: Emergency Medicine

## 2022-10-24 ENCOUNTER — Observation Stay (HOSPITAL_BASED_OUTPATIENT_CLINIC_OR_DEPARTMENT_OTHER)
Admission: EM | Admit: 2022-10-24 | Discharge: 2022-10-26 | Disposition: A | Discharge status: Home / Self Care | Payer: Self-pay | Attending: Emergency Medicine | Admitting: Emergency Medicine

## 2022-10-24 ENCOUNTER — Emergency Department (HOSPITAL_BASED_OUTPATIENT_CLINIC_OR_DEPARTMENT_OTHER): Payer: Self-pay

## 2022-10-24 ENCOUNTER — Emergency Department (HOSPITAL_COMMUNITY): Payer: Self-pay

## 2022-10-24 DIAGNOSIS — I5022 Chronic systolic (congestive) heart failure: Secondary | ICD-10-CM | POA: Diagnosis present

## 2022-10-24 DIAGNOSIS — N183 Chronic kidney disease, stage 3 unspecified: Secondary | ICD-10-CM | POA: Insufficient documentation

## 2022-10-24 DIAGNOSIS — L989 Disorder of the skin and subcutaneous tissue, unspecified: Secondary | ICD-10-CM | POA: Insufficient documentation

## 2022-10-24 DIAGNOSIS — I63012 Cerebral infarction due to thrombosis of left vertebral artery: Principal | ICD-10-CM | POA: Insufficient documentation

## 2022-10-24 DIAGNOSIS — Z7985 Long-term (current) use of injectable non-insulin antidiabetic drugs: Secondary | ICD-10-CM | POA: Insufficient documentation

## 2022-10-24 DIAGNOSIS — D649 Anemia, unspecified: Secondary | ICD-10-CM | POA: Insufficient documentation

## 2022-10-24 DIAGNOSIS — Z87891 Personal history of nicotine dependence: Secondary | ICD-10-CM | POA: Insufficient documentation

## 2022-10-24 DIAGNOSIS — I10 Essential (primary) hypertension: Secondary | ICD-10-CM | POA: Diagnosis present

## 2022-10-24 DIAGNOSIS — I130 Hypertensive heart and chronic kidney disease with heart failure and stage 1 through stage 4 chronic kidney disease, or unspecified chronic kidney disease: Secondary | ICD-10-CM | POA: Insufficient documentation

## 2022-10-24 DIAGNOSIS — R200 Anesthesia of skin: Secondary | ICD-10-CM

## 2022-10-24 DIAGNOSIS — Z79899 Other long term (current) drug therapy: Secondary | ICD-10-CM | POA: Insufficient documentation

## 2022-10-24 DIAGNOSIS — I5042 Chronic combined systolic (congestive) and diastolic (congestive) heart failure: Secondary | ICD-10-CM | POA: Insufficient documentation

## 2022-10-24 DIAGNOSIS — I639 Cerebral infarction, unspecified: Secondary | ICD-10-CM | POA: Diagnosis present

## 2022-10-24 LAB — CBC WITH DIFFERENTIAL/PLATELET
Abs Immature Granulocytes: 0.03 10*3/uL (ref 0.00–0.07)
Basophils Absolute: 0 10*3/uL (ref 0.0–0.1)
Basophils Relative: 1 %
Eosinophils Absolute: 0.1 10*3/uL (ref 0.0–0.5)
Eosinophils Relative: 2 %
HCT: 38.5 % — ABNORMAL LOW (ref 39.0–52.0)
Hemoglobin: 12.9 g/dL — ABNORMAL LOW (ref 13.0–17.0)
Immature Granulocytes: 1 %
Lymphocytes Relative: 30 %
Lymphs Abs: 1.8 10*3/uL (ref 0.7–4.0)
MCH: 28.9 pg (ref 26.0–34.0)
MCHC: 33.5 g/dL (ref 30.0–36.0)
MCV: 86.1 fL (ref 80.0–100.0)
Monocytes Absolute: 0.5 10*3/uL (ref 0.1–1.0)
Monocytes Relative: 9 %
Neutro Abs: 3.5 10*3/uL (ref 1.7–7.7)
Neutrophils Relative %: 57 %
Platelets: 243 10*3/uL (ref 150–400)
RBC: 4.47 MIL/uL (ref 4.22–5.81)
RDW: 12.4 % (ref 11.5–15.5)
WBC: 6 10*3/uL (ref 4.0–10.5)
nRBC: 0 % (ref 0.0–0.2)

## 2022-10-24 LAB — BASIC METABOLIC PANEL
Anion gap: 7 (ref 5–15)
BUN: 20 mg/dL (ref 6–20)
CO2: 25 mmol/L (ref 22–32)
Calcium: 9.1 mg/dL (ref 8.9–10.3)
Chloride: 109 mmol/L (ref 98–111)
Creatinine, Ser: 1.17 mg/dL (ref 0.61–1.24)
GFR, Estimated: >60 mL/min (ref >60)
Glucose, Bld: 101 mg/dL — ABNORMAL HIGH (ref 70–99)
Potassium: 4.3 mmol/L (ref 3.5–5.1)
Sodium: 141 mmol/L (ref 135–145)

## 2022-10-24 LAB — TROPONIN I (HIGH SENSITIVITY): Troponin I (High Sensitivity): 41 ng/L — ABNORMAL HIGH (ref <18)

## 2022-10-24 LAB — TSH: TSH: 2.189 u[IU]/mL (ref 0.350–4.500)

## 2022-10-24 NOTE — ED Notes (Signed)
ED Provider at bedside. 

## 2022-10-24 NOTE — ED Notes (Signed)
IV secured in place per order

## 2022-10-24 NOTE — ED Provider Notes (Signed)
[facility] EMERGENCY DEPARTMENT Provider Note   CSN: [id number] Arrival date & time: 10/24/22  1701     History  Chief Complaint  Patient presents with   Numbness    Bradley Wilson is a 46 y.o. male.  Patient here with right-sided face, arm and leg numbness since 10 AM today.  He was seen at [facility] overnight.  Symptoms started after he was discharged in the ED this morning.  He denies any weakness, speech changes, vision changes, dizziness, lightheadedness or near syncope symptoms.  He states that he was having some dizzy feelings at times yesterday and that is mostly why he was evaluated.  He is having some tingling throughout his body but he states at 10 AM when he got home he felt the whole right side of his body go numb.  He was very tired from being in the emergency department so he went to sleep and woke up still feeling the same.  He denies any chest pain or shortness of breath.  Has a history of high blood pressure, CKD, congestive heart failure, obesity.  The history is provided by the patient.       Home Medications Prior to Admission medications   Medication Sig Start Date End Date Taking? Authorizing Provider  acetaminophen (TYLENOL) 500 MG tablet Take 1,000 mg by mouth every 6 (six) hours as needed for mild pain or moderate pain.    [provider]  carvedilol (COREG) 25 MG tablet PLEASE SEE ATTACHED FOR DETAILED DIRECTIONS Patient taking differently: Take 25 mg by mouth in the morning and at bedtime. 05/26/18   Bensimhon, Bevelyn Buckles, MD  hydrALAZINE (APRESOLINE) 25 MG tablet Take 25 mg by mouth in the morning and at bedtime. 08/03/22   [provider]  lisinopril (PRINIVIL,ZESTRIL) 40 MG tablet Take 0.5 tablets (20 mg total) by mouth 2 (two) times daily. Patient taking differently: Take 40 mg by mouth daily. 01/21/18   Laurey Morale, MD  Multiple Vitamin (MULTI-VITAMIN DAILY PO) Take 1 tablet by mouth daily. Reported on 01/15/2016     [provider]  naproxen sodium (ALEVE) 220 MG tablet Take 440 mg by mouth daily as needed (pain).    [provider]  Semaglutide-Weight Management (WEGOVY) 1.7 MG/0.75ML SOAJ Inject 1.7 mg into the skin once a week. Patient not taking: Reported on 10/24/2022 08/03/22   [provider]  spironolactone (ALDACTONE) 50 MG tablet Take 50 mg by mouth daily.    [provider]  traMADol (ULTRAM) 50 MG tablet Take 100 mg by mouth 2 (two) times daily. 10/23/22 11/22/22  [provider]      Allergies    Bee venom and Shellfish allergy    Review of Systems   Review of Systems  Physical Exam Updated Vital Signs BP 136/81   Pulse 62   Temp 98.1 F (36.7 C)   Resp 16   SpO2 97%  Physical Exam Vitals and nursing note reviewed.  Constitutional:      General: He is not in acute distress.    Appearance: He is well-developed. He is not ill-appearing.  HENT:     Head: Normocephalic and atraumatic.     Nose: Nose normal.     Mouth/Throat:     Mouth: Mucous membranes are moist.  Eyes:     Extraocular Movements: Extraocular movements intact.     Conjunctiva/sclera: Conjunctivae normal.     Pupils: Pupils are equal, round, and reactive to light.  Cardiovascular:  Rate and Rhythm: Normal rate and regular rhythm.     Pulses: Normal pulses.     Heart sounds: Normal heart sounds. No murmur heard. Pulmonary:     Effort: Pulmonary effort is normal. No respiratory distress.     Breath sounds: Normal breath sounds.  Abdominal:     Palpations: Abdomen is soft.     Tenderness: There is no abdominal tenderness.  Musculoskeletal:        General: No swelling.     Cervical back: Normal range of motion and neck supple.  Skin:    General: Skin is warm and dry.     Capillary Refill: Capillary refill takes less than 2 seconds.  Neurological:     Mental Status: He is alert and oriented to person, place, and time.     Cranial Nerves: No cranial nerve  deficit.     Motor: No weakness.     Coordination: Coordination normal.     Gait: Gait normal.     Comments: Numbness to the right side of his face, arm, leg when compared to the left, normal sensation on the left, 5+ out of 5 strength, normal speech, normal visual fields, normal finger-nose-finger  Psychiatric:        Mood and Affect: Mood normal.     ED Results / Procedures / Treatments   Labs (all labs ordered are listed, but only abnormal results are displayed) Labs Reviewed  CBC WITH DIFFERENTIAL/PLATELET - Abnormal; Notable for the following components:      Result Value   Hemoglobin 12.9 (*)    HCT 38.5 (*)    All other components within normal limits  BASIC METABOLIC PANEL - Abnormal; Notable for the following components:   Glucose, Bld 101 (*)    All other components within normal limits    EKG EKG Interpretation  Date/Time:  Saturday October 24 2022 17:21:38 EST Ventricular Rate:  63 PR Interval:  184 QRS Duration: 106 QT Interval:  416 QTC Calculation: 425 R Axis:   18 Text Interpretation: Normal sinus rhythm with sinus arrhythmia Nonspecific T wave abnormality When compared with ECG of 23-Oct-2022 22:10, PREVIOUS ECG IS PRESENT No STEMI Confirmed by Alvester Chou [phone]) on 10/24/2022 5:44:24 PM  Radiology CT Head Wo Contrast  Result Date: 10/24/2022 CLINICAL DATA:  Right face numbness. EXAM: CT HEAD WITHOUT CONTRAST TECHNIQUE: Contiguous axial images were obtained from the base of the skull through the vertex without intravenous contrast. RADIATION DOSE REDUCTION: This exam was performed according to the departmental dose-optimization program which includes automated exposure control, adjustment of the mA and/or kV according to patient size and/or use of iterative reconstruction technique. COMPARISON:  None Available. FINDINGS: Brain: No evidence of acute infarction, hemorrhage, hydrocephalus, extra-axial collection or mass lesion/mass effect. Vascular: No  hyperdense vessel or unexpected calcification. Skull: Negative for fracture. Nonspecific sclerotic lesion along the left parietal calvarium (2, image 27). Sinuses/Orbits: No acute finding. Other: 1.5 cm soft tissue density lesion along the midline posterior scalp, nonspecific, possibly a sebaceous cyst. Interval for additional similar-appearing lesions, some of which with associated cutaneous irregularity (series 2, image 28, 29). There is an additional soft tissue density lesion in the posterior auricular soft tissues on the left (series 2, image 8). IMPRESSION: 1. No acute intracranial process. 2. Multiple soft tissue density lesions along the scalp are nonspecific. One of the lesions along the right frontal scalp / vertex has a cortical defect. Recommend correlation with physical exam to exclude the possibility of malignancy. 3. Nonspecific  sclerotic lesion along the left parietal calvarium. Electronically Signed   By: Lorenza Cambridge M.D.   On: 10/24/2022 18:31   DG Chest 2 View  Result Date: 10/23/2022 CLINICAL DATA:  Dizziness. EXAM: CHEST - 2 VIEW COMPARISON:  Chest x-ray 01/23/2013 FINDINGS: The heart is mildly enlarged, unchanged. There is no focal lung infiltrate, pleural effusion or pneumothorax. There are degenerative changes of the spine. IMPRESSION: 1. No active cardiopulmonary disease. 2. Mild cardiomegaly. Electronically Signed   By: Darliss Cheney M.D.   On: 10/23/2022 22:54    Procedures Procedures    Medications Ordered in ED Medications - No data to display  ED Course/ Medical Decision Making/ A&P                           Medical Decision Making Amount and/or Complexity of Data Reviewed Labs: ordered. Radiology: ordered.   Martie Lee is here with right-sided numbness.  History of CKD, heart failure, hypertension.  Patient with unremarkable vitals.  He was seen last night for near syncope symptoms.  Had unremarkable work-up per my review and interpretation of labs.  He had a  cardiac work-up and x-rays that were unremarkable.  He states that when he got home at 10 AM this morning he felt the right side of his body go numb.  He is very tired and went to bed and woke up and he still feels numb on the right side of his body.  He has no weakness or vision changes or speech issues.  He is outside the window for tPA.  He does not have any symptoms to suggest LVO.  He has no headache.  Differential diagnosis is possibly stroke versus stress related process.  Not having any headaches and doubt complex migraine.  Not having any pain in general seems less likely to be muscular vascular issue.  Is good good pulses throughout.  We will get a head CT, CBC and BMP will likely need to transfer to [facility] to get an MRI to further rule out stroke.  Lab work per my review and interpretation is unremarkable.  CT scan per radiology report shows no acute findings.  May be an incidental finding of a skin lesion that is concerning for may be malignancy in the right frontal scalp.  Clinically not quite sure what to make of that lesion on exam but will have him follow-up with his primary care doctor for possible biopsy and referral.  Patient has been accepted by Dr. Rush Landmark to [facility], ED in which she will drive personal vehicle to get MRI to further rule out stroke.  Patient hemodynamically stable to my care.  Exam is stable.  This chart was dictated using voice recognition software.  Despite best efforts to proofread,  errors can occur which can change the documentation meaning.         Final Clinical Impression(s) / ED Diagnoses Final diagnoses:  Numbness  Skin lesion    Rx / DC Orders ED Discharge Orders     None         Virgina Norfolk, DO 10/24/22 1838

## 2022-10-24 NOTE — ED Triage Notes (Signed)
Pt reports entire RT side (including face) numbness since around 10 am; he was discharged from [facility] ED at that time; he can use arm and leg, but sts it feels like he has had "Novocain"

## 2022-10-24 NOTE — Discharge Instructions (Signed)
All the lab work looks good today.  You may have had some extra heart beats that caused your symptoms or maybe from you recent trip and change in diet.  If you start having recurrent symptoms you should see your doctor as you would need a monitor.  If you start passing out, having chest pain or shortness of breath you need to come back to the ER.

## 2022-10-24 NOTE — Discharge Instructions (Signed)
Follow-up with your primary care doctor about incidental skin lesion found on your CT scan as discussed.  This is a lesion on the front right portion of her scalp.

## 2022-10-24 NOTE — ED Provider Notes (Signed)
[facility] EMERGENCY DEPARTMENT Provider Note   CSN: [id number] Arrival date & time: 10/23/22  2154     History  Chief Complaint  Patient presents with   Dizziness    Bradley Wilson is a 46 y.o. male.  Patient is a 46 year old male with a history of hypertension, chronic CHF with last echo in 2018 with an EF of 50 to 55%, CKD, morbid obesity and sleep apnea who is presenting today with several episodes of dizziness and feeling like he was going to pass out.  It started yesterday spontaneously while he was sitting.  It lasted 20 to 40 seconds and he reported it improved when he took some deep breaths.  He reports both hands felt kind of numb and tingly as well as his face.  He has never experienced anything like that before and he became nervous.  He did not have any chest pain, palpitations or shortness of breath.  He reports that he had a total of 4 episodes.  The last 1 was shortly after he arrived in the ER.  He has not had any for the last 5 or 6 hours.  He has not recently been ill or had changed any of his medications.  He is compliant with his medications.  He did recently go to [facility] and back and got back yesterday and did report he ate a few things he does not usually eat.  He has not noticed any leg swelling, no unilateral leg pain or calf swelling.  No prior history of PE or DVT and no significant family history.  He does not use tobacco products, drugs or alcohol.  Currently he reports he feels normal.  He denies any symptoms of unilateral numbness or weakness, difficulty with speech or thinking, no falls.  The history is provided by the patient.  Dizziness Quality:  Lightheadedness      Home Medications Prior to Admission medications   Medication Sig Start Date End Date Taking? Authorizing Provider  acetaminophen (TYLENOL) 500 MG tablet Take 1,000 mg by mouth every 6 (six) hours as needed for mild pain or moderate pain.   Yes [provider]  carvedilol (COREG) 25 MG tablet PLEASE SEE ATTACHED FOR DETAILED DIRECTIONS Patient taking differently: Take 25 mg by mouth in the morning and at bedtime. 05/26/18  Yes Bensimhon, Bevelyn Buckles, MD  hydrALAZINE (APRESOLINE) 25 MG tablet Take 25 mg by mouth in the morning and at bedtime. 08/03/22  Yes [provider]  lisinopril (PRINIVIL,ZESTRIL) 40 MG tablet Take 0.5 tablets (20 mg total) by mouth 2 (two) times daily. Patient taking differently: Take 40 mg by mouth daily. 01/21/18  Yes Laurey Morale, MD  Multiple Vitamin (MULTI-VITAMIN DAILY PO) Take 1 tablet by mouth daily. Reported on 01/15/2016   Yes [provider]  naproxen sodium (ALEVE) 220 MG tablet Take 440 mg by mouth daily as needed (pain).   Yes [provider]  traMADol (ULTRAM) 50 MG tablet Take 100 mg by mouth 2 (two) times daily. 10/23/22 11/22/22 Yes [provider]  Semaglutide-Weight Management (WEGOVY) 1.7 MG/0.75ML SOAJ Inject 1.7 mg into the skin once a week. Patient not taking: Reported on 10/24/2022 08/03/22   [provider]  spironolactone (ALDACTONE) 50 MG tablet Take 50 mg by mouth daily.    [provider]      Allergies    Bee venom and Shellfish allergy    Review of Systems   Review of Systems  Neurological:  Positive for dizziness.    Physical Exam Updated Vital Signs BP (!) 141/91   Pulse (!) 58   Temp 98 F (36.7 C) (Oral)   Resp 20   Ht 6' (1.829 m)   Wt (!) 142.9 kg   SpO2 99%   BMI 42.72 kg/m  Physical Exam Vitals and nursing note reviewed.  Constitutional:      General: He is not in acute distress.    Appearance: He is well-developed.  HENT:     Head: Normocephalic and atraumatic.  Eyes:     Conjunctiva/sclera: Conjunctivae normal.     Pupils: Pupils are equal, round, and reactive to light.  Cardiovascular:     Rate and Rhythm: Normal rate and regular rhythm.     Pulses: Normal pulses.     Heart sounds: No murmur  heard. Pulmonary:     Effort: Pulmonary effort is normal. No respiratory distress.     Breath sounds: Normal breath sounds. No wheezing or rales.  Abdominal:     General: There is no distension.     Palpations: Abdomen is soft.     Tenderness: There is no abdominal tenderness. There is no guarding or rebound.  Musculoskeletal:        General: No tenderness. Normal range of motion.     Cervical back: Normal range of motion and neck supple.     Comments: Trace edema at the sock line after sitting in the waiting room for 9 hours  Skin:    General: Skin is warm and dry.     Findings: No erythema or rash.  Neurological:     Mental Status: He is alert and oriented to person, place, and time. Mental status is at baseline.     Sensory: No sensory deficit.     Motor: No weakness.     Coordination: Coordination normal.     Gait: Gait normal.  Psychiatric:        Mood and Affect: Mood normal.        Behavior: Behavior normal.     ED Results / Procedures / Treatments   Labs (all labs ordered are listed, but only abnormal results are displayed) Labs Reviewed  BASIC METABOLIC PANEL - Abnormal; Notable for the following components:      Result Value   Creatinine, Ser 1.38 (*)    All other components within normal limits  TROPONIN I (HIGH SENSITIVITY) - Abnormal; Notable for the following components:   Troponin I (High Sensitivity) 49 (*)    All other components within normal limits  TROPONIN I (HIGH SENSITIVITY) - Abnormal; Notable for the following components:   Troponin I (High Sensitivity) 41 (*)    All other components within normal limits  CBC WITH DIFFERENTIAL/PLATELET  URINALYSIS, ROUTINE W REFLEX MICROSCOPIC  TSH    EKG EKG Interpretation  Date/Time:  Friday October 23 2022 22:10:33 EST Ventricular Rate:  70 PR Interval:  182 QRS Duration: 100 QT Interval:  412 QTC Calculation: 444 R Axis:   49 Text Interpretation: Sinus rhythm with sinus arrhythmia with occasional  Premature ventricular complexes Low voltage QRS Possible Inferior infarct , age undetermined Cannot rule out Anterior infarct , age undetermined When compared with ECG of 06-Dec-2013 06:51, PREVIOUS ECG IS PRESENT Confirmed by Gwyneth Sprout ([id number]) on 10/24/2022 7:04:22 AM  Radiology DG Chest 2 View  Result Date: 10/23/2022 CLINICAL DATA:  Dizziness. EXAM: CHEST - 2 VIEW COMPARISON:  Chest x-ray 01/23/2013 FINDINGS: The heart is mildly enlarged, unchanged.  There is no focal lung infiltrate, pleural effusion or pneumothorax. There are degenerative changes of the spine. IMPRESSION: 1. No active cardiopulmonary disease. 2. Mild cardiomegaly. Electronically Signed   By: Darliss Cheney M.D.   On: 10/23/2022 22:54    Procedures Procedures    Medications Ordered in ED Medications - No data to display  ED Course/ Medical Decision Making/ A&P                           Medical Decision Making Amount and/or Complexity of Data Reviewed External Data Reviewed: notes. Labs: ordered. Decision-making details documented in ED Course. Radiology: ordered and independent interpretation performed. Decision-making details documented in ED Course. ECG/medicine tests: ordered and independent interpretation performed. Decision-making details documented in ED Course.   Pt with multiple medical problems and comorbidities and presenting today with a complaint that caries a high risk for morbidity and mortality.  Here today with several brief episodes of feeling like he was going to pass out.  Reports his arms and face felt numb and he had to take some deep breaths and it resolved.  He had no palpitations, chest pain or shortness of breath during these events.  Patient was not wearing an Apple Watch and looking back based on his Apple Watch his heart rate did not go above 98 and has not gone below 58.  He has not had any recent change in his medications.  I independently interpreted patient's EKG and labs today.   EKG shows no acute changes.  No prolonged QR, shortened PR, Brugada's or WPW features.  CBC without acute findings, BMP with stable creatinine 1.36 normal electrolytes, troponin is 49 and 41 which is most likely his baseline with his known cardiac disease. I have independently visualized and interpreted pt's images today.  Chest x-ray with minimal cardiomegaly but no evidence of fluid overload. Patient's blood pressure has been stable and he has not had any morning medications.  Unclear cause of patient's symptoms today.  Possible short dysrhythmia versus related to his recent travel and change in diet.  We will send a TSH for PCP to follow-up with.  Low suspicion at this time for PE, ACS, dissection, stroke.  Do not feel that patient warrants any further work-up in the emergency room.  Findings were discussed with the patient.  He is comfortable with this plan.  He has a cardiologist and a PCP that he can follow-up with if he continues to have symptoms.  Discussed with him avoiding caffeinated items and sticking with his low-salt diet.  He will continue his current medications.          Final Clinical Impression(s) / ED Diagnoses Final diagnoses:  Dizziness    Rx / DC Orders ED Discharge Orders     None         Gwyneth Sprout, MD 10/24/22 0740

## 2022-10-25 ENCOUNTER — Observation Stay (HOSPITAL_BASED_OUTPATIENT_CLINIC_OR_DEPARTMENT_OTHER): Payer: Self-pay

## 2022-10-25 ENCOUNTER — Observation Stay (HOSPITAL_COMMUNITY): Payer: Self-pay

## 2022-10-25 DIAGNOSIS — I5022 Chronic systolic (congestive) heart failure: Secondary | ICD-10-CM

## 2022-10-25 DIAGNOSIS — I6389 Other cerebral infarction: Secondary | ICD-10-CM

## 2022-10-25 DIAGNOSIS — I159 Secondary hypertension, unspecified: Secondary | ICD-10-CM

## 2022-10-25 DIAGNOSIS — I639 Cerebral infarction, unspecified: Secondary | ICD-10-CM | POA: Diagnosis present

## 2022-10-25 LAB — LIPID PANEL
Cholesterol: 194 mg/dL (ref 0–200)
HDL: 32 mg/dL — ABNORMAL LOW (ref >40)
LDL Cholesterol: 114 mg/dL — ABNORMAL HIGH (ref 0–99)
Total CHOL/HDL Ratio: 6.1 RATIO
Triglycerides: 242 mg/dL — ABNORMAL HIGH (ref <150)
VLDL: 48 mg/dL — ABNORMAL HIGH (ref 0–40)

## 2022-10-25 LAB — ECHOCARDIOGRAM COMPLETE
AR max vel: 2.82 cm2
AV Area VTI: 2.87 cm2
AV Area mean vel: 2.95 cm2
AV Mean grad: 6 mmHg
AV Peak grad: 10.5 mmHg
Ao pk vel: 1.62 m/s
Area-P 1/2: 4.01 cm2
S' Lateral: 4.2 cm

## 2022-10-25 LAB — HIV ANTIBODY (ROUTINE TESTING W REFLEX): HIV Screen 4th Generation wRfx: NONREACTIVE

## 2022-10-25 MED ORDER — ASPIRIN 81 MG PO TBEC
81.0000 mg | DELAYED_RELEASE_TABLET | Freq: Every day | ORAL | Status: DC
  Administered 2022-10-26: 81 mg via ORAL
  Filled 2022-10-25: qty 1

## 2022-10-25 MED ORDER — TRAMADOL HCL 50 MG PO TABS
100.0000 mg | ORAL_TABLET | Freq: Two times a day (BID) | ORAL | Status: DC
  Administered 2022-10-25 – 2022-10-26 (×3): 100 mg via ORAL
  Filled 2022-10-25 (×3): qty 2

## 2022-10-25 MED ORDER — ACETAMINOPHEN 160 MG/5ML PO SOLN: 650.0000 mg | ORAL | Status: DC | PRN

## 2022-10-25 MED ORDER — ATORVASTATIN CALCIUM 10 MG PO TABS: 10.0000 mg | ORAL_TABLET | Freq: Every day | ORAL | Status: DC

## 2022-10-25 MED ORDER — STROKE: EARLY STAGES OF RECOVERY BOOK
Freq: Once | Status: AC
  Filled 2022-10-25: qty 1

## 2022-10-25 MED ORDER — ENOXAPARIN SODIUM 40 MG/0.4ML IJ SOSY
40.0000 mg | PREFILLED_SYRINGE | INTRAMUSCULAR | Status: DC
  Administered 2022-10-25 – 2022-10-26 (×2): 40 mg via SUBCUTANEOUS
  Filled 2022-10-25 (×2): qty 0.4

## 2022-10-25 MED ORDER — ATORVASTATIN CALCIUM 40 MG PO TABS
40.0000 mg | ORAL_TABLET | Freq: Every day | ORAL | Status: DC
  Administered 2022-10-25: 40 mg via ORAL
  Filled 2022-10-25: qty 1

## 2022-10-25 MED ORDER — ACETAMINOPHEN 650 MG RE SUPP: 650.0000 mg | RECTAL | Status: DC | PRN

## 2022-10-25 MED ORDER — IOHEXOL 350 MG/ML SOLN
75.0000 mL | Freq: Once | INTRAVENOUS | Status: AC | PRN
  Administered 2022-10-25: 75 mL via INTRAVENOUS

## 2022-10-25 MED ORDER — CLOPIDOGREL BISULFATE 75 MG PO TABS
75.0000 mg | ORAL_TABLET | Freq: Every day | ORAL | Status: DC
  Administered 2022-10-26: 75 mg via ORAL
  Filled 2022-10-25: qty 1

## 2022-10-25 MED ORDER — PERFLUTREN LIPID MICROSPHERE
1.0000 mL | INTRAVENOUS | Status: AC | PRN
  Administered 2022-10-25: 6 mL via INTRAVENOUS

## 2022-10-25 MED ORDER — ACETAMINOPHEN 325 MG PO TABS: 650.0000 mg | ORAL_TABLET | ORAL | Status: DC | PRN

## 2022-10-25 MED ORDER — CLOPIDOGREL BISULFATE 300 MG PO TABS
300.0000 mg | ORAL_TABLET | Freq: Once | ORAL | Status: AC
  Administered 2022-10-25: 300 mg via ORAL
  Filled 2022-10-25: qty 1

## 2022-10-25 MED ORDER — ASPIRIN 81 MG PO CHEW
81.0000 mg | CHEWABLE_TABLET | Freq: Once | ORAL | Status: AC
  Administered 2022-10-25: 81 mg via ORAL
  Filled 2022-10-25: qty 1

## 2022-10-25 NOTE — Progress Notes (Signed)
Patient admitted early this morning for numbness and was found to have acute ischemic stroke.  Neurology has been consulted.  Patient seen and examined at bedside and plan of care discussed with him.  I have reviewed this patient's medical records including this morning's H&P, current vitals, labs, medications myself.  Follow further neurology recommendations.

## 2022-10-25 NOTE — Progress Notes (Signed)
OT Cancellation Note  Patient Details Name: Jamonte Curfman MRN: [id number] DOB: 30-May-1976   Cancelled Treatment:    Reason Eval/Treat Not Completed: OT screened, no needs identified, will sign off. Spoke with evaluating PT and pt is back to baseline except for numbness on right side with proprioception and deep pressure intact so he is not having any issues with ADLs.  Ignacia Palma, OTR/L [facility] [phone] Office [phone]    Evette Georges 10/25/2022, 12:59 PM

## 2022-10-25 NOTE — Progress Notes (Signed)
Echocardiogram 2D Echocardiogram has been performed.  Warren Lacy Angelys Yetman RDCS 10/25/2022, 11:44 AM

## 2022-10-25 NOTE — Evaluation (Addendum)
Physical Therapy Evaluation/ Discharge Patient Details Name: Bradley Wilson MRN: [id number] DOB: 04/01/1976 Today's Date: 10/25/2022  History of Present Illness  46 yo male presented 11/18 with facial numbness and dizziness with MRI showing Lt thalamic CVA. PMhx: hypertensive cardiomyopathy, CKD, obesity, HTN, OSA, Rt hip dysplasia  Clinical Impression  Pt very pleasant, lives alone and has kids in the area. Pt reports progressive right hip deficits in strength and ROM related to hip dysplasia in need of THA but has not been able to lose weight for planning that sx yet. Pt with numbness on entire right side with pt able to sense touch, pressure, proprioception and strength intact. No current functional changes with mobility and activity. Pt aware of benefit of cane due to antalgic gait and benefits from cane for improved stability and stride with gait. Pt also educated for CVA risk factors and signs/symptoms BEFAST with pt able to verbalize understanding. Pt able to compensate for sensory deficits and does not require further therapy intervention at this time will sign off with pt aware and agreeable.         Recommendations for follow up therapy are one component of a multi-disciplinary discharge planning process, led by the attending physician.  Recommendations may be updated based on patient status, additional functional criteria and insurance authorization.  Follow Up Recommendations No PT follow up      Assistance Recommended at Discharge None  Patient can return home with the following       Equipment Recommendations Cane  Recommendations for Other Services       Functional Status Assessment Patient has not had a recent decline in their functional status     Precautions / Restrictions Precautions Precautions: None      Mobility  Bed Mobility Overal bed mobility: Modified Independent                  Transfers Overall transfer level: Modified independent                       Ambulation/Gait Ambulation/Gait assistance: Modified independent (Device/Increase time) Gait Distance (Feet): 250 Feet Assistive device: Rolling walker (2 wheels), None Gait Pattern/deviations: Step-through pattern, Antalgic, Decreased stance time - right   Gait velocity interpretation: >2.62 ft/sec, indicative of community ambulatory   General Gait Details: pt with antalgic gait related to right hip dysplasia with improved stride with use of RW however pt states he would prefer use of cane to get around apartment with pt able to simulate cane with folded RW  Stairs            Wheelchair Mobility    Modified Rankin (Stroke Patients Only)       Balance Overall balance assessment: No apparent balance deficits (not formally assessed)                                           Pertinent Vitals/Pain Pain Assessment Pain Assessment: No/denies pain    Home Living Family/patient expects to be discharged to:: Private residence Living Arrangements: Alone Available Help at Discharge: Family;Available 24 hours/day Type of Home: Apartment Home Access: Stairs to enter   Entrance Stairs-Number of Steps: 14   Home Layout: One level Home Equipment: None      Prior Function Prior Level of Function : Independent/Modified Independent  Hand Dominance        Extremity/Trunk Assessment   Upper Extremity Assessment Upper Extremity Assessment: RUE deficits/detail RUE Deficits / Details: decreased light touch with pt able to sense touch but reports decreased compared to left. Strength, proprioception and pressure intact    Lower Extremity Assessment Lower Extremity Assessment: RLE deficits/detail RLE Deficits / Details: decreased strength and ROM baseline due to hip dysplasia. impaired light touch. Proprioception and pressure intact    Cervical / Trunk Assessment Cervical / Trunk Assessment: Normal   Communication   Communication: No difficulties  Cognition Arousal/Alertness: Awake/alert Behavior During Therapy: WFL for tasks assessed/performed Overall Cognitive Status: Within Functional Limits for tasks assessed                                          General Comments      Exercises     Assessment/Plan    PT Assessment Patient does not need any further PT services  PT Problem List         PT Treatment Interventions      PT Goals (Current goals can be found in the Care Plan section)  Acute Rehab PT Goals PT Goal Formulation: All assessment and education complete, DC therapy    Frequency       Co-evaluation               AM-PAC PT "6 Clicks" Mobility  Outcome Measure Help needed turning from your back to your side while in a flat bed without using bedrails?: None Help needed moving from lying on your back to sitting on the side of a flat bed without using bedrails?: None Help needed moving to and from a bed to a chair (including a wheelchair)?: None Help needed standing up from a chair using your arms (e.g., wheelchair or bedside chair)?: None Help needed to walk in hospital room?: None Help needed climbing 3-5 steps with a railing? : None 6 Click Score: 24    End of Session   Activity Tolerance: Patient tolerated treatment well Patient left: in bed;with call bell/phone within reach Nurse Communication: Mobility status PT Visit Diagnosis: Other abnormalities of gait and mobility (R26.89)    Time: 1202-1224 PT Time Calculation (min) (ACUTE ONLY): 22 min   Charges:   PT Evaluation $PT Eval Low Complexity: 1 Low          Kennett Symes P, PT Acute Rehabilitation Services Office: [phone]   Enedina Finner Jomayra Novitsky 10/25/2022, 1:04 PM

## 2022-10-25 NOTE — H&P (Signed)
History and Physical    Patient: Bradley Wilson KKX:[id number] DOB: 08/15/1976 DOA: 10/24/2022 DOS: the patient was seen and examined on 10/25/2022 PCP: Lyndon Code, MD  Patient coming from: Home  Chief Complaint:  Chief Complaint  Patient presents with   Numbness   HPI: Bradley Wilson is a 46 y.o. male with medical history significant of HFrEF 25% EF in 2014, recovery of EF to 50-55% as of most recent 2d Echo in 2017.  Clean coronaries on LHC, unlcear cause of his CHF in the first place.  HTN, morbid obesity.  Pt presents to ED with c/o right-sided numbness since 10 AM today.  He was seen in the ED overnight for near syncopal symptoms and discharged home.  States once he left the hospital is when he developed the symptoms.  States he feels like he has been shot with "Novocain" all throughout his right side.  Wife states she took off his shoe and he did not notice.    Review of Systems: As mentioned in the history of present illness. All other systems reviewed and are negative. Past Medical History:  Diagnosis Date   Chronic systolic CHF (congestive heart failure) (HCC)    a. Dx 11/2013 - likely due to hypertensive heart disease/NICM (EF 25%, normal cors).   CKD (chronic kidney disease) stage 3, GFR 30-59 ml/min (HCC)    Enlarged lymph node    Hypertension    Morbid obesity (HCC)    Sleep apnea    Past Surgical History:  Procedure Laterality Date   FINGER SURGERY     LEFT AND RIGHT HEART CATHETERIZATION WITH CORONARY ANGIOGRAM N/A 12/08/2013   Procedure: LEFT AND RIGHT HEART CATHETERIZATION WITH CORONARY ANGIOGRAM;  Surgeon: Laurey Morale, MD;  Location: [facility] CATH LAB;  Service: Cardiovascular;  Laterality: N/A;   Social History:  reports that he quit smoking about 10 years ago. His smoking use included cigarettes. He has a 5.50 pack-year smoking history. He does not have any smokeless tobacco history on file. He reports that he does not drink alcohol and does not use  drugs.  Allergies  Allergen Reactions   Bee Venom Anaphylaxis   Shellfish Allergy Anaphylaxis    Family History  Problem Relation Age of Onset   Heart attack Father 7    Prior to Admission medications   Medication Sig Start Date End Date Taking? Authorizing Provider  acetaminophen (TYLENOL) 500 MG tablet Take 1,000 mg by mouth every 6 (six) hours as needed for mild pain or moderate pain.   Yes [provider]  carvedilol (COREG) 25 MG tablet PLEASE SEE ATTACHED FOR DETAILED DIRECTIONS Patient taking differently: Take 25 mg by mouth in the morning and at bedtime. 05/26/18  Yes Bensimhon, Bevelyn Buckles, MD  hydrALAZINE (APRESOLINE) 25 MG tablet Take 25 mg by mouth 3 (three) times daily as needed (high blood pressure). 08/03/22  Yes [provider]  lisinopril (PRINIVIL,ZESTRIL) 40 MG tablet Take 0.5 tablets (20 mg total) by mouth 2 (two) times daily. Patient taking differently: Take 40 mg by mouth daily. 01/21/18  Yes Laurey Morale, MD  Multiple Vitamin (MULTI-VITAMIN DAILY PO) Take 1 tablet by mouth daily. Reported on 01/15/2016   Yes [provider]  naproxen sodium (ALEVE) 220 MG tablet Take 440 mg by mouth daily as needed (pain).   Yes [provider]  Semaglutide-Weight Management (WEGOVY) 1.7 MG/0.75ML SOAJ Inject 1.7 mg into the skin once a week. 08/03/22  Yes [provider]  spironolactone (ALDACTONE) 50 MG  tablet Take 50 mg by mouth daily.   Yes [provider]  traMADol (ULTRAM) 50 MG tablet Take 100 mg by mouth 2 (two) times daily. 10/23/22 11/22/22 Yes [provider]    Physical Exam: Vitals:   10/25/22 0050 10/25/22 0054 10/25/22 0100 10/25/22 0115  BP:   134/86 (!) 144/97  Pulse: 69  61 61  Resp: 14  11 13   Temp:  98.4 F (36.9 C)    TempSrc:  Oral    SpO2: 99%  98% 99%   Constitutional: NAD, calm, comfortable Eyes: PERRL, lids and conjunctivae normal ENMT: Mucous membranes are moist. Posterior pharynx  clear of any exudate or lesions.Normal dentition.  Neck: normal, supple, no masses, no thyromegaly Respiratory: clear to auscultation bilaterally, no wheezing, no crackles. Normal respiratory effort. No accessory muscle use.  Cardiovascular: Regular rate and rhythm, no murmurs / rubs / gallops. No extremity edema. 2+ pedal pulses. No carotid bruits.  Abdomen: no tenderness, no masses palpated. No hepatosplenomegaly. Bowel sounds positive.  Musculoskeletal: no clubbing / cyanosis. No joint deformity upper and lower extremities. Good ROM, no contractures. Normal muscle tone.  Skin: no rashes, lesions, ulcers. No induration Neurologic: Pt with decreased sensation in RUE, RLE, and R side of face compared to L Psychiatric: Normal judgment and insight. Alert and oriented x 3. Normal mood.   Data Reviewed:    MRI demonstrates acute L thalamic stroke.  Assessment and Plan: * Acute ischemic stroke ([place]) No KNOWN h/o PAF, but I'm now suspicious given cryptogenic stroke + CHF + very young patient to be having stroke DDx includes LV thrombus if his EF got worse again (had EF 25%, improved to 50-55% as of 2017 echo). Stroke pathway Neuro consult 2d echo Tele monitor CTA head and neck PT/OT/SLP ASA + Plavix for the moment.  Chronic systolic heart failure (HCC) NICM, was seeing CHF clinic but hasn't since 2018 it looks like. EF was 25% but had improved to 50-55% as of 2017. Holding home BP meds in setting of acute ischemic stroke. Getting 2d echo as part of stroke work up. No KNOWN h/o PAF, but I'm now suspicious given cryptogenic stroke + CHF + very young patient to be having stroke, especially with no known h/o DM2.   Hypertension Holding home BP meds in setting of acute stroke.      Advance Care Planning:   Code Status: Full Code  Consults: Neurology  Family Communication: No family in room  Severity of Illness: The appropriate patient status for this patient is OBSERVATION.  Observation status is judged to be reasonable and necessary in order to provide the required intensity of service to ensure the patient's safety. The patient's presenting symptoms, physical exam findings, and initial radiographic and laboratory data in the context of their medical condition is felt to place them at decreased risk for further clinical deterioration. Furthermore, it is anticipated that the patient will be medically stable for discharge from the hospital within 2 midnights of admission.   Author: Etta Quill., DO 10/25/2022 1:47 AM  For on call review [email or web address].

## 2022-10-25 NOTE — Progress Notes (Signed)
SLP Cancellation Note  Patient Details Name: Bradley Wilson MRN: [id number] DOB: 1975-12-15   Cancelled treatment:       Reason Eval/Treat Not Completed: SLP screened, no needs identified, will sign off. SLP spoke with PT who evaluated patient and reviewed chart and no needs identified at this time. Thank you for this consult!  Angela Nevin, MA, CCC-SLP Speech Therapy

## 2022-10-25 NOTE — Consult Note (Signed)
Neurology Consultation Reason for Consult: Stroke on MRI Requesting Physician: Lyda Perone  CC: Right sided numbness    History is obtained from: Patient, wife at bedside and chart review   HPI: Bradley Wilson is a 46 y.o. male with a past medical history significant for hypertensive cardiomyopathy (2014, most recent ECHO in 2017 with recovered EF and left atrial enlargement), CKD stage III, hypertension, obesity on [place] V, obstructive sleep apnea on CPAP, remote smoking history (5.5 pack years).  He reports he had had a couple of episodes of dizziness lasting 30 to 40 seconds for which he had presented to the ED for evaluation.  He reports while he was in the ED he had some right-sided numbness (face arm and leg) developed this was relatively mild.  After discharge he returned home and took a nap but on awakening had worsened right-sided numbness for which she represented to the ED for evaluation.  At the time of my evaluation his symptoms are nearly resolved but still present   LKW: 10 AM on 11/18 Thrombolytic given?: No, out of the window IA performed?: No, no LVO Premorbid modified rankin scale:      0 - No symptoms.   ROS: All other review of systems was negative except as noted in the HPI.   Past Medical History:  Diagnosis Date   Chronic systolic CHF (congestive heart failure) (HCC)    a. Dx 11/2013 - likely due to hypertensive heart disease/NICM (EF 25%, normal cors).   CKD (chronic kidney disease) stage 3, GFR 30-59 ml/min (HCC)    Enlarged lymph node    Hypertension    Morbid obesity (HCC)    Sleep apnea    Past Surgical History:  Procedure Laterality Date   FINGER SURGERY     LEFT AND RIGHT HEART CATHETERIZATION WITH CORONARY ANGIOGRAM N/A 12/08/2013   Procedure: LEFT AND RIGHT HEART CATHETERIZATION WITH CORONARY ANGIOGRAM;  Surgeon: Laurey Morale, MD;  Location: [facility] CATH LAB;  Service: Cardiovascular;  Laterality: N/A;   Current Outpatient Medications  Medication  Instructions   acetaminophen (TYLENOL) 1,000 mg, Oral, Every 6 hours PRN   carvedilol (COREG) 25 MG tablet PLEASE SEE ATTACHED FOR DETAILED DIRECTIONS   hydrALAZINE (APRESOLINE) 25 mg, Oral, 3 times daily PRN   lisinopril (ZESTRIL) 20 mg, Oral, 2 times daily   Multiple Vitamin (MULTI-VITAMIN DAILY PO) 1 tablet, Oral, Daily, Reported on 01/15/2016   naproxen sodium (ALEVE) 440 mg, Oral, Daily PRN   spironolactone (ALDACTONE) 50 mg, Oral, Daily   traMADol (ULTRAM) 100 mg, Oral, 2 times daily   Wegovy 1.7 mg, Subcutaneous, Weekly     Family History  Problem Relation Age of Onset   Heart attack Father 95    Social History:  reports that he quit smoking about 10 years ago. His smoking use included cigarettes. He has a 5.50 pack-year smoking history. He does not have any smokeless tobacco history on file. He reports that he does not drink alcohol and does not use drugs.   Exam: Current vital signs: BP (!) 144/97   Pulse 61   Temp 98.4 F (36.9 C) (Oral)   Resp 13   SpO2 99%  Vital signs in last 24 hours: Temp:  [98 F (36.7 C)-98.9 F (37.2 C)] 98.4 F (36.9 C) (11/19 0054) Pulse Rate:  [58-76] 61 (11/19 0115) Resp:  [11-20] 13 (11/19 0115) BP: (130-155)/(80-104) 144/97 (11/19 0115) SpO2:  [96 %-100 %] 99 % (11/19 0115)   Physical Exam  Constitutional: Appears  well-developed and well-nourished.  Psych: Affect appropriate to situation, calm and cooperative Eyes: No scleral injection HENT: No oropharyngeal obstruction.  MSK: Significant osteoarthritic disease of the right hip and left knee, with audible crepitus/ratcheting of the left knee on movement Cardiovascular: Normal rate and regular rhythm. Perfusing extremities well.  Prior EKG with some atrial arrhythmia, not atrial fibrillation Respiratory: Effort normal, non-labored breathing GI: Soft.  No distension. There is no tenderness.  Skin: Warm dry and intact visible skin  Neuro: Mental Status: Patient is awake, alert,  oriented to person, place, month, year, and situation. Patient is able to give a clear and coherent history. No signs of aphasia or neglect Cranial Nerves: II: Visual Fields are full. Pupils are equal, round, and reactive to light.   III,IV, VI: EOMI without ptosis or diploplia.  V: Facial sensation is symmetric to temperature VII: Facial movement is symmetric, mild left facial droop at rest but symmetric activation which wife also reports is new in the last day but patient has not noticed  VIII: hearing is intact to voice X: Uvula elevates symmetrically XI: Shoulder shrug is symmetric. XII: tongue is midline without atrophy or fasciculations.  Motor: Tone is normal. Bulk is normal. 5/5 strength was present in all four extremities.  Mild pronation of the left upper extremity without drift Sensory: Sensation is symmetric to light temperature in the arms and legs but he reports some mild loss of fine touch in the right face arm and leg Deep Tendon Reflexes: 2+ and symmetric in the brachioradialis and patellae, relatively diminished in the brachioradialis relative to patella but still 2+.  Cerebellar: FNF and heel-to-finger are intact bilaterally (avoided heel-to-shin given significant right hip osteoarthritis) Gait:  Deferred  NIHSS total 1 Score breakdown: Mild sensory loss in the right face arm and leg Performed at 5 AM time of patient arrival to ED    I have reviewed labs in epic and the results pertinent to this consultation are:  Basic Metabolic Panel: Recent Labs  Lab 10/23/22 2237 10/24/22 1756  NA 142 141  K 4.3 4.3  CL 105 109  CO2 25 25  GLUCOSE 93 101*  BUN 18 20  CREATININE 1.38* 1.17  CALCIUM 9.4 9.1    CBC: Recent Labs  Lab 10/23/22 2237 10/24/22 1756  WBC 6.8 6.0  NEUTROABS 3.1 3.5  HGB 13.1 12.9*  HCT 39.3 38.5*  MCV 87.5 86.1  PLT 255 243    Coagulation Studies: No results for input(s): "LABPROT", "INR" in the last 72 hours.   Lab Results   Component Value Date   CHOL 248 08/28/2009   HDL 31 03/05/2009   LDLCALC 89 03/05/2009   TRIG 248 03/05/2009    Lab Results  Component Value Date   HGBA1C 5.6 12/08/2013   Recent labs at [facility] from Feb similar LDL and A1c   TSH 2.189  ECHO 2017 - Left ventricle: The cavity size was normal. There was moderate    concentric hypertrophy. Systolic function was normal. The    estimated ejection fraction was in the range of 50% to 55%.    Features are consistent with a pseudonormal left ventricular    filling pattern, with concomitant abnormal relaxation and    increased filling pressure (grade 2 diastolic dysfunction).    Acoustic contrast opacification revealed no evidence ofthrombus.  - Left atrium: The atrium was severely dilated.     I have reviewed the images obtained:  Head CT 11/18 personally reviewed, agree with radiology:  1. No acute intracranial process. 2. Multiple soft tissue density lesions along the scalp are nonspecific. One of the lesions along the right frontal scalp / vertex has a cortical defect. Recommend correlation with physical exam to exclude the possibility of malignancy. 3. Nonspecific sclerotic lesion along the left parietal calvarium.   MRI brain 11/19 personally reviewed, agree with radiology:   Small acute infarct of the left thalamus. No hemorrhage or mass effect.  CTA 2:10 AM personally reviewed, agree with radiology:   Normal CTA of the head and neck.  Impression: Most likely small vessel disease leading to small thalamic stroke, especially given fluctuating symptoms, however his presenting symptom was dizziness and he has known left atrial enlargement due to his hypertensive heart disease, therefore merits careful consideration for potential cardioembolic source  Recommendations: # Left thalamic pure sensory stroke - Stroke labs RPR, HgbA1c, fasting lipid panel - Frequent neuro checks - Echocardiogram - Aspirin - dose 325mg  PO or  300mg  PR, followed by 81 mg daily - Plavix 300 mg load with 75 mg daily for 21 - 90 day course  - Given previously minimally elevated cholesterol we will start atorvastatin 10 mg nightly to promote tolerability but may need adjustment to high intensity dose if LDL is significantly elevated from prior  -After seeing patient, cholesterol resulted with LDL of 114, will initiate atorvastatin 40 mg for goal LDL less than 70 - Risk factor modification, patient already pursuing weight loss, adherent to medications, discuss diet and exercise - Telemetry monitoring; 30 day event monitor on discharge if no arrythmias captured or other indication for long term anticoagulation determined given known severe left atrial enlargement - Blood pressure goal   - Permissive hypertension to 220/120 due to acute stroke for 24 to 48 hours - PT consult, OT consult, Speech consult, unless patient is back to baseline - Stroke team to follow  MD-PhD [facility] [phone] Available 7 PM to 7 AM, outside of these hours please call Neurologist on call as listed on Amion.

## 2022-10-25 NOTE — ED Notes (Signed)
Patient reports improvement in sensation to R side. Reports he does not feel that R side is "as numb as it was."

## 2022-10-25 NOTE — Assessment & Plan Note (Addendum)
No KNOWN h/o PAF, but I'm now suspicious given cryptogenic stroke + CHF + very young patient to be having stroke DDx includes LV thrombus if his EF got worse again (had EF 25%, improved to 50-55% as of 2017 echo). Stroke pathway Neuro consult 2d echo Tele monitor CTA head and neck PT/OT/SLP ASA + Plavix for the moment.

## 2022-10-25 NOTE — ED Notes (Signed)
ED TO INPATIENT HANDOFF REPORT  ED Nurse Name and Phone #: Joice Lofts [phone]  S Name/Age/Gender Bradley Wilson 46 y.o. male Room/Bed: 004C/004C  Code Status   Code Status: Full Code  Home/SNF/Other Home Patient oriented to: self, place, time, and situation Is this baseline? Yes   Triage Complete: Triage complete  Chief Complaint Acute ischemic stroke [facility]) [I63.9]  Triage Note Pt reports entire RT side (including face) numbness since around 10 am; he was discharged from [facility] ED at that time; he can use arm and leg, but sts it feels like he has had "Novocain"   Allergies Allergies  Allergen Reactions   Bee Venom Anaphylaxis   Shellfish Allergy Anaphylaxis    Level of Care/Admitting Diagnosis ED Disposition     ED Disposition  Admit   Condition  --   Comment  Hospital Area: [facility] [[id number]]  Level of Care: Telemetry Medical [104]  May place patient in observation at [facility] or [facility] if equivalent level of care is available:: No  Covid Evaluation: Asymptomatic - no recent exposure (last 10 days) testing not required  Diagnosis: Acute ischemic stroke [facility]) [[id number]]  Admitting Physician: Hillary Bow [phone]  Attending Physician: Hillary Bow [phone]          B Medical/Surgery History Past Medical History:  Diagnosis Date   Chronic systolic CHF (congestive heart failure) (HCC)    a. Dx 11/2013 - likely due to hypertensive heart disease/NICM (EF 25%, normal cors).   CKD (chronic kidney disease) stage 3, GFR 30-59 ml/min (HCC)    Enlarged lymph node    Hypertension    Morbid obesity (HCC)    Sleep apnea    Past Surgical History:  Procedure Laterality Date   FINGER SURGERY     LEFT AND RIGHT HEART CATHETERIZATION WITH CORONARY ANGIOGRAM N/A 12/08/2013   Procedure: LEFT AND RIGHT HEART CATHETERIZATION WITH CORONARY ANGIOGRAM;  Surgeon: Laurey Morale, MD;  Location: [facility] CATH LAB;  Service: Cardiovascular;  Laterality: N/A;      A IV Location/Drains/Wounds Patient Lines/Drains/Airways Status     Active Line/Drains/Airways     Name Placement date Placement time Site Days   Peripheral IV 10/24/22 20 G Left Antecubital 10/24/22  1754  Antecubital  1            Intake/Output Last 24 hours No intake or output data in the 24 hours ending 10/25/22 1316  Labs/Imaging Results for orders placed or performed during the hospital encounter of 10/24/22 (from the past 48 hour(s))  CBC with Differential     Status: Abnormal   Collection Time: 10/24/22  5:56 PM  Result Value Ref Range   WBC 6.0 4.0 - 10.5 K/uL   RBC 4.47 4.22 - 5.81 MIL/uL   Hemoglobin 12.9 (L) 13.0 - 17.0 g/dL   HCT 67.3 (L) 41.9 - 37.9 %   MCV 86.1 80.0 - 100.0 fL   MCH 28.9 26.0 - 34.0 pg   MCHC 33.5 30.0 - 36.0 g/dL   RDW 02.4 09.7 - 35.3 %   Platelets 243 150 - 400 K/uL   nRBC 0.0 0.0 - 0.2 %   Neutrophils Relative % 57 %   Neutro Abs 3.5 1.7 - 7.7 K/uL   Lymphocytes Relative 30 %   Lymphs Abs 1.8 0.7 - 4.0 K/uL   Monocytes Relative 9 %   Monocytes Absolute 0.5 0.1 - 1.0 K/uL   Eosinophils Relative 2 %   Eosinophils Absolute 0.1 0.0 -  0.5 K/uL   Basophils Relative 1 %   Basophils Absolute 0.0 0.0 - 0.1 K/uL   Immature Granulocytes 1 %   Abs Immature Granulocytes 0.03 0.00 - 0.07 K/uL    Comment: Performed at [facility], [address] [place], [place] [address]  Basic metabolic panel     Status: Abnormal   Collection Time: 10/24/22  5:56 PM  Result Value Ref Range   Sodium 141 135 - 145 mmol/L   Potassium 4.3 3.5 - 5.1 mmol/L   Chloride 109 98 - 111 mmol/L   CO2 25 22 - 32 mmol/L   Glucose, Bld 101 (H) 70 - 99 mg/dL    Comment: Glucose reference range applies only to samples taken after fasting for at least 8 hours.   BUN 20 6 - 20 mg/dL   Creatinine, Ser 8.67 0.61 - 1.24 mg/dL   Calcium 9.1 8.9 - 61.9 mg/dL   GFR, Estimated >50 >93 mL/min    Comment: (NOTE) Calculated using the CKD-EPI Creatinine Equation  (2021)    Anion gap 7 5 - 15    Comment: Performed at [facility], [address] [place], [place] [address]  HIV Antibody (routine testing w rflx)     Status: None   Collection Time: 10/25/22  3:36 AM  Result Value Ref Range   HIV Screen 4th Generation wRfx Non Reactive Non Reactive    Comment: Performed at [facility], [address], [place], [place] [address]  Lipid panel     Status: Abnormal   Collection Time: 10/25/22  3:36 AM  Result Value Ref Range   Cholesterol 194 0 - 200 mg/dL   Triglycerides 983 (H) <150 mg/dL   HDL 32 (L) >38 mg/dL   Total CHOL/HDL Ratio 6.1 RATIO   VLDL 48 (H) 0 - 40 mg/dL   LDL Cholesterol 250 (H) 0 - 99 mg/dL    Comment:        Total Cholesterol/HDL:CHD Risk Coronary Heart Disease Risk Table                     Men   Women  1/2 Average Risk   3.4   3.3  Average Risk       5.0   4.4  2 X Average Risk   9.6   7.1  3 X Average Risk  23.4   11.0        Use the calculated Patient Ratio above and the CHD Risk Table to determine the patient's CHD Risk.        ATP III CLASSIFICATION (LDL):  <100     mg/dL   Optimal  539-767  mg/dL   Near or Above                    Optimal  130-159  mg/dL   Borderline  341-937  mg/dL   High  >902     mg/dL   Very High Performed at [facility], [address], [place], [place] [address]    ECHOCARDIOGRAM COMPLETE  Result Date: 10/25/2022    ECHOCARDIOGRAM REPORT   Patient Name:   Bradley Wilson Date of Exam: 10/25/2022 Medical Rec #:  [id number]    Height:       72.0 in Accession #:    [id number]   Weight:       315.0 lb Date of Birth:  Sep 14, 1976     BSA:  2.584 m Patient Age:    46 years     BP:           134/91 mmHg Patient Gender: M            HR:           65 bpm. Exam Location:  Inpatient Procedure: 2D Echo, Color Doppler, Cardiac Doppler and Intracardiac            Opacification Agent Indications:    Stroke i63.9  History:        Patient has prior history of Echocardiogram  examinations, most                 recent 01/27/2016. CHF; Risk Factors:Hypertension and Sleep                 Apnea.  Sonographer:    Irving Burton Senior RDCS Referring Phys: [phone] JARED M GARDNER  Sonographer Comments: Technically difficult study due to patient body habitus. IMPRESSIONS  1. Technically difficult study; LV function appears to be normal; if clinically indicated, TEE would have greater sensitivity for source of embolus.  2. Left ventricular ejection fraction, by estimation, is 55 to 60%. The left ventricle has normal function. The left ventricle has no regional wall motion abnormalities. The left ventricular internal cavity size was severely dilated. Left ventricular diastolic parameters are consistent with Grade I diastolic dysfunction (impaired relaxation).  3. Right ventricular systolic function is normal. The right ventricular size is normal.  4. Left atrial size was mildly dilated.  5. The mitral valve is normal in structure. No evidence of mitral valve regurgitation. No evidence of mitral stenosis.  6. The aortic valve is tricuspid. Aortic valve regurgitation is not visualized. No aortic stenosis is present. FINDINGS  Left Ventricle: Left ventricular ejection fraction, by estimation, is 55 to 60%. The left ventricle has normal function. The left ventricle has no regional wall motion abnormalities. Definity contrast agent was given IV to delineate the left ventricular  endocardial borders. The left ventricular internal cavity size was severely dilated. There is no left ventricular hypertrophy. Left ventricular diastolic parameters are consistent with Grade I diastolic dysfunction (impaired relaxation). Right Ventricle: The right ventricular size is normal. Right ventricular systolic function is normal. Left Atrium: Left atrial size was mildly dilated. Right Atrium: Right atrial size was normal in size. Pericardium: There is no evidence of pericardial effusion. Mitral Valve: The mitral valve is normal in  structure. No evidence of mitral valve regurgitation. No evidence of mitral valve stenosis. Tricuspid Valve: The tricuspid valve is normal in structure. Tricuspid valve regurgitation is not demonstrated. No evidence of tricuspid stenosis. Aortic Valve: The aortic valve is tricuspid. Aortic valve regurgitation is not visualized. No aortic stenosis is present. Aortic valve mean gradient measures 6.0 mmHg. Aortic valve peak gradient measures 10.5 mmHg. Aortic valve area, by VTI measures 2.87  cm. Pulmonic Valve: The pulmonic valve was normal in structure. Pulmonic valve regurgitation is not visualized. No evidence of pulmonic stenosis. Aorta: The aortic root is normal in size and structure. Venous: The inferior vena cava was not well visualized. IAS/Shunts: The interatrial septum was not well visualized. Additional Comments: Technically difficult study; LV function appears to be normal; if clinically indicated, TEE would have greater sensitivity for source of embolus.  LEFT VENTRICLE PLAX 2D LVIDd:         6.60 cm   Diastology LVIDs:         4.20 cm   LV e' medial:  7.07 cm/s LV PW:         0.90 cm   LV E/e' medial:  11.4 LV IVS:        0.90 cm   LV e' lateral:   8.49 cm/s LVOT diam:     2.30 cm   LV E/e' lateral: 9.5 LV SV:         93 LV SV Index:   36 LVOT Area:     4.15 cm  RIGHT VENTRICLE RV S prime:     12.60 cm/s TAPSE (M-mode): 2.7 cm LEFT ATRIUM              Index        RIGHT ATRIUM           Index LA diam:        5.30 cm  2.05 cm/m   RA Area:     23.90 cm LA Vol (A2C):   88.4 ml  34.21 ml/m  RA Volume:   73.60 ml  28.48 ml/m LA Vol (A4C):   117.0 ml 45.27 ml/m LA Biplane Vol: 103.0 ml 39.86 ml/m  AORTIC VALVE AV Area (Vmax):    2.82 cm AV Area (Vmean):   2.95 cm AV Area (VTI):     2.87 cm AV Vmax:           162.00 cm/s AV Vmean:          116.000 cm/s AV VTI:            0.326 m AV Peak Grad:      10.5 mmHg AV Mean Grad:      6.0 mmHg LVOT Vmax:         110.00 cm/s LVOT Vmean:        82.300 cm/s  LVOT VTI:          0.225 m LVOT/AV VTI ratio: 0.69  AORTA Ao Root diam: 3.10 cm MITRAL VALVE MV Area (PHT): 4.01 cm    SHUNTS MV Decel Time: 189 msec    Systemic VTI:  0.22 m MV E velocity: 80.40 cm/s  Systemic Diam: 2.30 cm MV A velocity: 88.10 cm/s MV E/A ratio:  0.91 Olga Millers MD Electronically signed by Olga Millers MD Signature Date/Time: 10/25/2022/12:44:30 PM    Final    CT ANGIO HEAD NECK W WO CM  Result Date: 10/25/2022 CLINICAL DATA:  Numbness EXAM: CT ANGIOGRAPHY HEAD AND NECK TECHNIQUE: Multidetector CT imaging of the head and neck was performed using the standard protocol during bolus administration of intravenous contrast. Multiplanar CT image reconstructions and MIPs were obtained to evaluate the vascular anatomy. Carotid stenosis measurements (when applicable) are obtained utilizing NASCET criteria, using the distal internal carotid diameter as the denominator. RADIATION DOSE REDUCTION: This exam was performed according to the departmental dose-optimization program which includes automated exposure control, adjustment of the mA and/or kV according to patient size and/or use of iterative reconstruction technique. CONTRAST:  71mL OMNIPAQUE IOHEXOL 350 MG/ML SOLN COMPARISON:  None Available. FINDINGS: CTA NECK FINDINGS SKELETON: There is no bony spinal canal stenosis. No lytic or blastic lesion. OTHER NECK: Normal pharynx, larynx and major salivary glands. No cervical lymphadenopathy. Unremarkable thyroid gland. UPPER CHEST: No pneumothorax or pleural effusion. No nodules or masses. AORTIC ARCH: There is no calcific atherosclerosis of the aortic arch. There is no aneurysm, dissection or hemodynamically significant stenosis of the visualized portion of the aorta. Conventional 3 vessel aortic branching pattern. The visualized proximal subclavian arteries are widely patent. RIGHT CAROTID SYSTEM:  Normal without aneurysm, dissection or stenosis. LEFT CAROTID SYSTEM: Normal without aneurysm,  dissection or stenosis. VERTEBRAL ARTERIES: Left dominant configuration. Both origins are clearly patent. There is no dissection, occlusion or flow-limiting stenosis to the skull base (V1-V3 segments). CTA HEAD FINDINGS POSTERIOR CIRCULATION: --Vertebral arteries: Normal V4 segments. --Inferior cerebellar arteries: Normal. --Basilar artery: Normal. --Superior cerebellar arteries: Normal. --Posterior cerebral arteries (PCA): Normal. ANTERIOR CIRCULATION: --Intracranial internal carotid arteries: Normal. --Anterior cerebral arteries (ACA): Normal. Both A1 segments are present. Patent anterior communicating artery (a-comm). --Middle cerebral arteries (MCA): Normal. VENOUS SINUSES: As permitted by contrast timing, patent. ANATOMIC VARIANTS: None Review of the MIP images confirms the above findings. IMPRESSION: Normal CTA of the head and neck. Electronically Signed   By: Deatra Robinson M.D.   On: 10/25/2022 02:23   MR BRAIN WO CONTRAST  Result Date: 10/24/2022 CLINICAL DATA:  Acute neurologic deficit EXAM: MRI HEAD WITHOUT CONTRAST TECHNIQUE: Multiplanar, multiecho pulse sequences of the brain and surrounding structures were obtained without intravenous contrast. COMPARISON:  None Available. FINDINGS: Brain: There is a small acute infarct of the left thalamus. No acute or chronic hemorrhage. There is multifocal hyperintense T2-weighted signal within the white matter. Parenchymal volume and CSF spaces are normal. The midline structures are normal. Vascular: Major flow voids are preserved. Skull and upper cervical spine: Normal calvarium and skull base. Visualized upper cervical spine and soft tissues are normal. Sinuses/Orbits:No paranasal sinus fluid levels or advanced mucosal thickening. No mastoid or middle ear effusion. Normal orbits. IMPRESSION: Small acute infarct of the left thalamus. No hemorrhage or mass effect. Electronically Signed   By: Deatra Robinson M.D.   On: 10/24/2022 22:52   CT Head Wo  Contrast  Result Date: 10/24/2022 CLINICAL DATA:  Right face numbness. EXAM: CT HEAD WITHOUT CONTRAST TECHNIQUE: Contiguous axial images were obtained from the base of the skull through the vertex without intravenous contrast. RADIATION DOSE REDUCTION: This exam was performed according to the departmental dose-optimization program which includes automated exposure control, adjustment of the mA and/or kV according to patient size and/or use of iterative reconstruction technique. COMPARISON:  None Available. FINDINGS: Brain: No evidence of acute infarction, hemorrhage, hydrocephalus, extra-axial collection or mass lesion/mass effect. Vascular: No hyperdense vessel or unexpected calcification. Skull: Negative for fracture. Nonspecific sclerotic lesion along the left parietal calvarium (2, image 27). Sinuses/Orbits: No acute finding. Other: 1.5 cm soft tissue density lesion along the midline posterior scalp, nonspecific, possibly a sebaceous cyst. Interval for additional similar-appearing lesions, some of which with associated cutaneous irregularity (series 2, image 28, 29). There is an additional soft tissue density lesion in the posterior auricular soft tissues on the left (series 2, image 8). IMPRESSION: 1. No acute intracranial process. 2. Multiple soft tissue density lesions along the scalp are nonspecific. One of the lesions along the right frontal scalp / vertex has a cortical defect. Recommend correlation with physical exam to exclude the possibility of malignancy. 3. Nonspecific sclerotic lesion along the left parietal calvarium. Electronically Signed   By: Lorenza Cambridge M.D.   On: 10/24/2022 18:31   DG Chest 2 View  Result Date: 10/23/2022 CLINICAL DATA:  Dizziness. EXAM: CHEST - 2 VIEW COMPARISON:  Chest x-ray 01/23/2013 FINDINGS: The heart is mildly enlarged, unchanged. There is no focal lung infiltrate, pleural effusion or pneumothorax. There are degenerative changes of the spine. IMPRESSION: 1. No  active cardiopulmonary disease. 2. Mild cardiomegaly. Electronically Signed   By: Darliss Cheney M.D.   On: 10/23/2022 22:54    Pending  Labs [employer] (From admission, onward)     Start     Ordered   10/25/22 0447  RPR  Add-on,   AD        10/25/22 0446   10/25/22 0134  Hemoglobin A1c  (Labs)  Once,   R       Comments: To assess prior glycemic control    10/25/22 0141            Vitals/Pain Today's Vitals   10/25/22 1006 10/25/22 1107 10/25/22 1200 10/25/22 1245  BP: (!) 144/80  (!) 148/82 139/84  Pulse: 76  65 79  Resp: 18  (!) 22 15  Temp: 98.3 F (36.8 C)     TempSrc: Oral     SpO2: 95%  98% 97%  PainSc:  0-No pain      Isolation Precautions No active isolations  Medications Medications   stroke: early stages of recovery book (has no administration in time range)  acetaminophen (TYLENOL) tablet 650 mg (has no administration in time range)    Or  acetaminophen (TYLENOL) 160 MG/5ML solution 650 mg (has no administration in time range)    Or  acetaminophen (TYLENOL) suppository 650 mg (has no administration in time range)  enoxaparin (LOVENOX) injection 40 mg (40 mg Subcutaneous Given 10/25/22 1007)  aspirin EC tablet 81 mg (has no administration in time range)  clopidogrel (PLAVIX) tablet 75 mg (has no administration in time range)  traMADol (ULTRAM) tablet 100 mg (100 mg Oral Given 10/25/22 1007)  atorvastatin (LIPITOR) tablet 40 mg (has no administration in time range)  perflutren lipid microspheres (DEFINITY) IV suspension (6 mLs Intravenous Given 10/25/22 1143)  aspirin chewable tablet 81 mg (81 mg Oral Given 10/25/22 0049)  clopidogrel (PLAVIX) tablet 300 mg (300 mg Oral Given 10/25/22 0152)  iohexol (OMNIPAQUE) 350 MG/ML injection 75 mL (75 mLs Intravenous Contrast Given 10/25/22 0203)    Mobility walks with device Low fall risk   Focused Assessments Neuro Assessment Handoff:  Swallow screen pass? Yes    NIH Stroke Scale ( + Modified Stroke  Scale Criteria)  Interval: Shift assessment Level of Consciousness (1a.)   : Alert, keenly responsive LOC Questions (1b. )   +: Answers both questions correctly LOC Commands (1c. )   + : Performs both tasks correctly Best Gaze (2. )  +: Normal Visual (3. )  +: No visual loss Facial Palsy (4. )    : Normal symmetrical movements Motor Arm, Left (5a. )   +: No drift Motor Arm, Right (5b. )   +: No drift Motor Leg, Left (6a. )   +: No drift Motor Leg, Right (6b. )   +: No drift Limb Ataxia (7. ): Absent Sensory (8. )   +: Mild-to-moderate sensory loss, patient feels pinprick is less sharp or is dull on the affected side, or there is a loss of superficial pain with pinprick, but patient is aware of being touched Best Language (9. )   +: No aphasia Dysarthria (10. ): Normal Extinction/Inattention (11.)   +: No Abnormality Modified SS Total  +: 1 Complete NIHSS TOTAL: 1     Neuro Assessment: Exceptions to WDL (patient has numbness to Right side) Neuro Checks:   Initial (10/25/22 0046)  Last Documented NIHSS Modified Score: 1 (10/25/22 1200) Has TPA been given? No If patient is a Neuro Trauma and patient is going to OR before floor call report to 4N Charge nurse: [phone] or [phone]   R Recommendations: See Admitting  Provider Note  Report given to:   Additional Notes:

## 2022-10-25 NOTE — ED Notes (Signed)
PT working with patient.

## 2022-10-25 NOTE — ED Provider Notes (Signed)
Transferred here from [facility] for MRI.  See prior note for full H&P.  Briefly, 46 year old male presenting to the ED with right-sided numbness since 10 AM today.  He was seen in the ED overnight for near syncopal symptoms and discharged home.  States once he left the hospital is when he developed the symptoms.  States he feels like he has been shot with "Novocain" all throughout his right side.  Wife states she took off his shoe and he did not notice.  Results for orders placed or performed during the hospital encounter of 10/24/22  CBC with Differential  Result Value Ref Range   WBC 6.0 4.0 - 10.5 K/uL   RBC 4.47 4.22 - 5.81 MIL/uL   Hemoglobin 12.9 (L) 13.0 - 17.0 g/dL   HCT 62.8 (L) 31.5 - 17.6 %   MCV 86.1 80.0 - 100.0 fL   MCH 28.9 26.0 - 34.0 pg   MCHC 33.5 30.0 - 36.0 g/dL   RDW 16.0 73.7 - 10.6 %   Platelets 243 150 - 400 K/uL   nRBC 0.0 0.0 - 0.2 %   Neutrophils Relative % 57 %   Neutro Abs 3.5 1.7 - 7.7 K/uL   Lymphocytes Relative 30 %   Lymphs Abs 1.8 0.7 - 4.0 K/uL   Monocytes Relative 9 %   Monocytes Absolute 0.5 0.1 - 1.0 K/uL   Eosinophils Relative 2 %   Eosinophils Absolute 0.1 0.0 - 0.5 K/uL   Basophils Relative 1 %   Basophils Absolute 0.0 0.0 - 0.1 K/uL   Immature Granulocytes 1 %   Abs Immature Granulocytes 0.03 0.00 - 0.07 K/uL  Basic metabolic panel  Result Value Ref Range   Sodium 141 135 - 145 mmol/L   Potassium 4.3 3.5 - 5.1 mmol/L   Chloride 109 98 - 111 mmol/L   CO2 25 22 - 32 mmol/L   Glucose, Bld 101 (H) 70 - 99 mg/dL   BUN 20 6 - 20 mg/dL   Creatinine, Ser 2.69 0.61 - 1.24 mg/dL   Calcium 9.1 8.9 - 48.5 mg/dL   GFR, Estimated >46 >27 mL/min   Anion gap 7 5 - 15   MR BRAIN WO CONTRAST  Result Date: 10/24/2022 CLINICAL DATA:  Acute neurologic deficit EXAM: MRI HEAD WITHOUT CONTRAST TECHNIQUE: Multiplanar, multiecho pulse sequences of the brain and surrounding structures were obtained without intravenous contrast. COMPARISON:  None Available.  FINDINGS: Brain: There is a small acute infarct of the left thalamus. No acute or chronic hemorrhage. There is multifocal hyperintense T2-weighted signal within the white matter. Parenchymal volume and CSF spaces are normal. The midline structures are normal. Vascular: Major flow voids are preserved. Skull and upper cervical spine: Normal calvarium and skull base. Visualized upper cervical spine and soft tissues are normal. Sinuses/Orbits:No paranasal sinus fluid levels or advanced mucosal thickening. No mastoid or middle ear effusion. Normal orbits. IMPRESSION: Small acute infarct of the left thalamus. No hemorrhage or mass effect. Electronically Signed   By: Deatra Robinson M.D.   On: 10/24/2022 22:52   CT Head Wo Contrast  Result Date: 10/24/2022 CLINICAL DATA:  Right face numbness. EXAM: CT HEAD WITHOUT CONTRAST TECHNIQUE: Contiguous axial images were obtained from the base of the skull through the vertex without intravenous contrast. RADIATION DOSE REDUCTION: This exam was performed according to the departmental dose-optimization program which includes automated exposure control, adjustment of the mA and/or kV according to patient size and/or use of iterative reconstruction technique. COMPARISON:  None Available. FINDINGS:  Brain: No evidence of acute infarction, hemorrhage, hydrocephalus, extra-axial collection or mass lesion/mass effect. Vascular: No hyperdense vessel or unexpected calcification. Skull: Negative for fracture. Nonspecific sclerotic lesion along the left parietal calvarium (2, image 27). Sinuses/Orbits: No acute finding. Other: 1.5 cm soft tissue density lesion along the midline posterior scalp, nonspecific, possibly a sebaceous cyst. Interval for additional similar-appearing lesions, some of which with associated cutaneous irregularity (series 2, image 28, 29). There is an additional soft tissue density lesion in the posterior auricular soft tissues on the left (series 2, image 8).  IMPRESSION: 1. No acute intracranial process. 2. Multiple soft tissue density lesions along the scalp are nonspecific. One of the lesions along the right frontal scalp / vertex has a cortical defect. Recommend correlation with physical exam to exclude the possibility of malignancy. 3. Nonspecific sclerotic lesion along the left parietal calvarium. Electronically Signed   By: Lorenza Cambridge M.D.   On: 10/24/2022 18:31      MRI was performed here, acute left thalamic stroke noted.  No hemorrhage or mass effect.  No history of TIA or stroke in the past.  No family stroke history.  Will give dose of aspirin, consult with neurology and admit for further management.  1:10 AM Spoke with on call neurology, Dr. Iver Nestle.  Agrees with ASA, recommends to load with Plavix, 300mg  PO.  She will evaluate. Admit to medicine.  Discussed with hospitalist, Dr. -- will admit.   Julian Reil, PA-C 10/25/22 0145    10/27/22, MD 10/25/22 [id number]

## 2022-10-25 NOTE — Assessment & Plan Note (Addendum)
Bradley Wilson, was seeing CHF clinic but hasn't since 2018 it looks like. EF was 25% but had improved to 50-55% as of 2017. Holding home BP meds in setting of acute ischemic stroke. Getting 2d echo as part of stroke work up. No KNOWN h/o PAF, but I'm now suspicious given cryptogenic stroke + CHF + very young patient to be having stroke, especially with no known h/o DM2.

## 2022-10-25 NOTE — ED Notes (Signed)
Patient ambulated to bathroom without assistance. Steady gait noted.  

## 2022-10-25 NOTE — Progress Notes (Addendum)
STROKE TEAM PROGRESS NOTE   INTERVAL HISTORY Patient is sitting on the side of the bed. He tells me that he lost his job and has been searching for a new one. He did request assistance in finding a PCP, obtaining a cane, and with medications as he does not have insurance. TOC consult placed. Of note he has not been on [facility] since June due to the cost, awaiting Ha1c results.  He is also saw Dr. Gala Romney at [facility] for his cardiac issues.   Vitals:   10/25/22 1006 10/25/22 1200 10/25/22 1245 10/25/22 1315  BP: (!) 144/80 (!) 148/82 139/84 (!) 114/95  Pulse: 76 65 79 87  Resp: 18 (!) 22 15 16   Temp: 98.3 F (36.8 C)     TempSrc: Oral     SpO2: 95% 98% 97% 99%   CBC:  Recent Labs  Lab 10/23/22 2237 10/24/22 1756  WBC 6.8 6.0  NEUTROABS 3.1 3.5  HGB 13.1 12.9*  HCT 39.3 38.5*  MCV 87.5 86.1  PLT 255 243   Basic Metabolic Panel:  Recent Labs  Lab 10/23/22 2237 10/24/22 1756  NA 142 141  K 4.3 4.3  CL 105 109  CO2 25 25  GLUCOSE 93 101*  BUN 18 20  CREATININE 1.38* 1.17  CALCIUM 9.4 9.1   Lipid Panel:  Recent Labs  Lab 10/25/22 0336  CHOL 194  TRIG 242*  HDL 32*  CHOLHDL 6.1  VLDL 48*  LDLCALC 114*   HgbA1c: No results for input(s): "HGBA1C" in the last 168 hours. Urine Drug Screen: No results for input(s): "LABOPIA", "COCAINSCRNUR", "LABBENZ", "AMPHETMU", "THCU", "LABBARB" in the last 168 hours.  Alcohol Level No results for input(s): "ETH" in the last 168 hours.  IMAGING past 24 hours ECHOCARDIOGRAM COMPLETE  Result Date: 10/25/2022    ECHOCARDIOGRAM REPORT   Patient Name:   Bradley Wilson Date of Exam: 10/25/2022 Medical Rec #:  [id number]    Height:       72.0 in Accession #:    [id number]   Weight:       315.0 lb Date of Birth:  [birth year]     BSA:          2.584 m Patient Age:    46 years     BP:           134/91 mmHg Patient Gender: M            HR:           65 bpm. Exam Location:  Inpatient Procedure: 2D Echo, Color Doppler, Cardiac Doppler and  Intracardiac            Opacification Agent Indications:    Stroke i63.9  History:        [name], most                 recent 01/27/2016. CHF; Risk Factors:Hypertension and Sleep                 Apnea.  Sonographer:    01/29/2016 Senior RDCS Referring Phys: [phone] JARED M GARDNER  Sonographer Comments: Technically difficult study due to patient body habitus. IMPRESSIONS  1. Technically difficult study; LV function appears to be normal; if clinically indicated, TEE would have greater sensitivity for source of embolus.  2. Left ventricular ejection fraction, by estimation, is 55 to 60%. The left ventricle has normal function. The left ventricle has no regional wall motion abnormalities. The left ventricular  internal cavity size was severely dilated. Left ventricular diastolic parameters are consistent with Grade I diastolic dysfunction (impaired relaxation).  3. Right ventricular systolic function is normal. The right ventricular size is normal.  4. Left atrial size was mildly dilated.  5. The mitral valve is normal in structure. No evidence of mitral valve regurgitation. No evidence of mitral stenosis.  6. The aortic valve is tricuspid. Aortic valve regurgitation is not visualized. No aortic stenosis is present. FINDINGS  Left Ventricle: Left ventricular ejection fraction, by estimation, is 55 to 60%. The left ventricle has normal function. The left ventricle has no regional wall motion abnormalities. Definity contrast agent was given IV to delineate the left ventricular  endocardial borders. The left ventricular internal cavity size was severely dilated. There is no left ventricular hypertrophy. Left ventricular diastolic parameters are consistent with Grade I diastolic dysfunction (impaired relaxation). Right Ventricle: The right ventricular size is normal. Right ventricular systolic function is normal. Left Atrium: Left atrial size was mildly dilated. Right Atrium: Right  atrial size was normal in size. Pericardium: There is no evidence of pericardial effusion. Mitral Valve: The mitral valve is normal in structure. No evidence of mitral valve regurgitation. No evidence of mitral valve stenosis. Tricuspid Valve: The tricuspid valve is normal in structure. Tricuspid valve regurgitation is not demonstrated. No evidence of tricuspid stenosis. Aortic Valve: The aortic valve is tricuspid. Aortic valve regurgitation is not visualized. No aortic stenosis is present. Aortic valve mean gradient measures 6.0 mmHg. Aortic valve peak gradient measures 10.5 mmHg. Aortic valve area, by VTI measures 2.87  cm. Pulmonic Valve: The pulmonic valve was normal in structure. Pulmonic valve regurgitation is not visualized. No evidence of pulmonic stenosis. Aorta: The aortic root is normal in size and structure. Venous: The inferior vena cava was not well visualized. IAS/Shunts: The interatrial septum was not well visualized. Additional Comments: Technically difficult study; LV function appears to be normal; if clinically indicated, TEE would have greater sensitivity for source of embolus.  LEFT VENTRICLE PLAX 2D LVIDd:         6.60 cm   Diastology LVIDs:         4.20 cm   LV e' medial:    7.07 cm/s LV PW:         0.90 cm   LV E/e' medial:  11.4 LV IVS:        0.90 cm   LV e' lateral:   8.49 cm/s LVOT diam:     2.30 cm   LV E/e' lateral: 9.5 LV SV:         93 LV SV Index:   36 LVOT Area:     4.15 cm  RIGHT VENTRICLE RV S prime:     12.60 cm/s TAPSE (M-mode): 2.7 cm LEFT ATRIUM              Index        RIGHT ATRIUM           Index LA diam:        5.30 cm  2.05 cm/m   RA Area:     23.90 cm LA Vol (A2C):   88.4 ml  34.21 ml/m  RA Volume:   73.60 ml  28.48 ml/m LA Vol (A4C):   117.0 ml 45.27 ml/m LA Biplane Vol: 103.0 ml 39.86 ml/m  AORTIC VALVE AV Area (Vmax):    2.82 cm AV Area (Vmean):   2.95 cm AV Area (VTI):     2.87 cm AV  Vmax:           162.00 cm/s AV Vmean:          116.000 cm/s AV VTI:             0.326 m AV Peak Grad:      10.5 mmHg AV Mean Grad:      6.0 mmHg LVOT Vmax:         110.00 cm/s LVOT Vmean:        82.300 cm/s LVOT VTI:          0.225 m LVOT/AV VTI ratio: 0.69  AORTA Ao Root diam: 3.10 cm MITRAL VALVE MV Area (PHT): 4.01 cm    SHUNTS MV Decel Time: 189 msec    Systemic VTI:  0.22 m MV E velocity: 80.40 cm/s  Systemic Diam: 2.30 cm MV A velocity: 88.10 cm/s MV E/A ratio:  0.91 Olga Millers MD Electronically signed by Olga Millers MD Signature Date/Time: 10/25/2022/12:44:30 PM    Final    CT ANGIO HEAD NECK W WO CM  Result Date: 10/25/2022 CLINICAL DATA:  Numbness EXAM: CT ANGIOGRAPHY HEAD AND NECK TECHNIQUE: Multidetector CT imaging of the head and neck was performed using the standard protocol during bolus administration of intravenous contrast. Multiplanar CT image reconstructions and MIPs were obtained to evaluate the vascular anatomy. Carotid stenosis measurements (when applicable) are obtained utilizing NASCET criteria, using the distal internal carotid diameter as the denominator. RADIATION DOSE REDUCTION: This exam was performed according to the departmental dose-optimization program which includes automated exposure control, adjustment of the mA and/or kV according to patient size and/or use of iterative reconstruction technique. CONTRAST:  75mL OMNIPAQUE IOHEXOL 350 MG/ML SOLN COMPARISON:  None Available. FINDINGS: CTA NECK FINDINGS SKELETON: There is no bony spinal canal stenosis. No lytic or blastic lesion. OTHER NECK: Normal pharynx, larynx and major salivary glands. No cervical lymphadenopathy. Unremarkable thyroid gland. UPPER CHEST: No pneumothorax or pleural effusion. No nodules or masses. AORTIC ARCH: There is no calcific atherosclerosis of the aortic arch. There is no aneurysm, dissection or hemodynamically significant stenosis of the visualized portion of the aorta. Conventional 3 vessel aortic branching pattern. The visualized proximal subclavian arteries are  widely patent. RIGHT CAROTID SYSTEM: Normal without aneurysm, dissection or stenosis. LEFT CAROTID SYSTEM: Normal without aneurysm, dissection or stenosis. VERTEBRAL ARTERIES: Left dominant configuration. Both origins are clearly patent. There is no dissection, occlusion or flow-limiting stenosis to the skull base (V1-V3 segments). CTA HEAD FINDINGS POSTERIOR CIRCULATION: --Vertebral arteries: Normal V4 segments. --Inferior cerebellar arteries: Normal. --Basilar artery: Normal. --Superior cerebellar arteries: Normal. --Posterior cerebral arteries (PCA): Normal. ANTERIOR CIRCULATION: --Intracranial internal carotid arteries: Normal. --Anterior cerebral arteries (ACA): Normal. Both A1 segments are present. Patent anterior communicating artery (a-comm). --Middle cerebral arteries (MCA): Normal. VENOUS SINUSES: As permitted by contrast timing, patent. ANATOMIC VARIANTS: None Review of the MIP images confirms the above findings. IMPRESSION: Normal CTA of the head and neck. Electronically Signed   By: Deatra Robinson M.D.   On: 10/25/2022 02:23   MR BRAIN WO CONTRAST  Result Date: 10/24/2022 CLINICAL DATA:  Acute neurologic deficit EXAM: MRI HEAD WITHOUT CONTRAST TECHNIQUE: Multiplanar, multiecho pulse sequences of the brain and surrounding structures were obtained without intravenous contrast. COMPARISON:  None Available. FINDINGS: Brain: There is a small acute infarct of the left thalamus. No acute or chronic hemorrhage. There is multifocal hyperintense T2-weighted signal within the white matter. Parenchymal volume and CSF spaces are normal. The midline structures are normal. Vascular: Major flow voids are  preserved. Skull and upper cervical spine: Normal calvarium and skull base. Visualized upper cervical spine and soft tissues are normal. Sinuses/Orbits:No paranasal sinus fluid levels or advanced mucosal thickening. No mastoid or middle ear effusion. Normal orbits. IMPRESSION: Small acute infarct of the left  thalamus. No hemorrhage or mass effect. Electronically Signed   By: Deatra Robinson M.D.   On: 10/24/2022 22:52   CT Head Wo Contrast  Result Date: 10/24/2022 CLINICAL DATA:  Right face numbness. EXAM: CT HEAD WITHOUT CONTRAST TECHNIQUE: Contiguous axial images were obtained from the base of the skull through the vertex without intravenous contrast. RADIATION DOSE REDUCTION: This exam was performed according to the departmental dose-optimization program which includes automated exposure control, adjustment of the mA and/or kV according to patient size and/or use of iterative reconstruction technique. COMPARISON:  None Available. FINDINGS: Brain: No evidence of acute infarction, hemorrhage, hydrocephalus, extra-axial collection or mass lesion/mass effect. Vascular: No hyperdense vessel or unexpected calcification. Skull: Negative for fracture. Nonspecific sclerotic lesion along the left parietal calvarium (2, image 27). Sinuses/Orbits: No acute finding. Other: 1.5 cm soft tissue density lesion along the midline posterior scalp, nonspecific, possibly a sebaceous cyst. Interval for additional similar-appearing lesions, some of which with associated cutaneous irregularity (series 2, image 28, 29). There is an additional soft tissue density lesion in the posterior auricular soft tissues on the left (series 2, image 8). IMPRESSION: 1. No acute intracranial process. 2. Multiple soft tissue density lesions along the scalp are nonspecific. One of the lesions along the right frontal scalp / vertex has a cortical defect. Recommend correlation with physical exam to exclude the possibility of malignancy. 3. Nonspecific sclerotic lesion along the left parietal calvarium. Electronically Signed   By: Lorenza Cambridge M.D.   On: 10/24/2022 18:31    PHYSICAL EXAM Constitutional: Appears well-developed and well-nourished.  Cardiovascular: Normal rate and regular rhythm. Perfusing extremities well.  Prior EKG with some atrial  arrhythmia, not atrial fibrillation Respiratory: Effort normal, non-labored breathing   Neuro: Mental Status: Patient is awake, alert, oriented to person, place, month, year, and situation. Patient is able to give a clear and coherent history. No signs of aphasia or neglect Cranial Nerves: II: Visual Fields are full. Pupils are equal, round, and reactive to light.   III,IV, VI: EOMI without ptosis or diploplia.  V: Facial sensation is symmetric to temperature VII: Facial movement is symmetric, mild left facial droop at rest but symmetric activation which wife also reports is new in the last day but patient has not noticed  VIII: hearing is intact to voice X: Uvula elevates symmetrically XI: Shoulder shrug is symmetric. XII: tongue is midline without atrophy or fasciculations.  Motor: Tone is normal. Bulk is normal. 5/5 strength was present in all four extremities.  Mild pronation of the left upper extremity without drift Sensory: Sensation is symmetric to light temperature in the arms and legs but he reports some mild loss of fine touch in the right face arm and leg Deep Tendon Reflexes: 2+ and symmetric in the brachioradialis and patellae, relatively diminished in the brachioradialis relative to patella but still 2+.  Cerebellar: FNF and heel-to-finger are intact bilaterally (avoided heel-to-shin given significant right hip osteoarthritis) Gait:  Deferred  ASSESSMENT/PLAN Bradley Wilson is a 46 y.o. male with history of hypertensive cardiomyopathy (2014, most recent ECHO in 2017 with recovered EF and left atrial enlargement), CKD stage III, hypertension, obesity on Vigo V, obstructive sleep apnea on CPAP, remote smoking history (5.5 pack years) presenting  with an episode of transient dizziness, right sided numbness..   Stroke:  Left thalamic stroke  Etiology:  small vessel disease  Code Stroke CT head No acute abnormality.  1. No acute intracranial process. 2. Multiple soft  tissue density lesions along the scalp are nonspecific. One of the lesions along the right frontal scalp / vertex has a cortical defect. Recommend correlation with physical exam to exclude the possibility of malignancy. 3. Nonspecific sclerotic lesion along the left parietal calvarium. CTA head & neck  Normal CTA head and neck MRI   Small acute infarct of the left thalamus. No hemorrhage or mass effect. 2D Echo EF 55-60%, Grade 1 diastolic dysfunction, LV severely dilated.  LDL 114 HgbA1c No results found for requested labs within last 1095 days. VTE prophylaxis - Lovenox    Diet   Diet Heart Room service appropriate? Yes; Fluid consistency: Thin   No antithrombotic prior to admission, now on aspirin 81 mg daily and clopidogrel 75 mg daily for 3 weeks and the ASA 81mg  alone Therapy recommendations:  Pending Disposition:  Pending  Hypertension Home meds:  Coreg, hydralazine, lisinopril, spironolactone Stable Permissive hypertension (OK if < 220/120) but gradually normalize in 3-4 days Long-term BP goal normotensive  Hyperlipidemia Home meds:  None LDL 114, goal < 70 Add atorvastatin 40mg  Continue statin at discharge  Diabetes type II Uncontrolled Home meds:  semaglutide HgbA1c No results found for requested labs within last 1095 days., goal < 7.0 CBGs SSI  Other Stroke Risk Factors Cigarette smoker, advised to stop smoking ETOH use, alcohol level No results found for requested labs within last 1095 days., advised to drink no more than 2 drink(s) a day Obesity, There is no height or weight on file to calculate BMI., BMI >/= 30 associated with increased stroke risk, recommend weight loss, diet and exercise as appropriate   Other Active Problems Hypertensive cardiomyopathy Previously followed by [facility]. Repeat echo shows EF 55-60%  Hospital day # 0  Patient seen and examined by NP/APP with MD. MD to update note as needed.   Elmer Picker, DNP, FNP-BC [facility] Pager: [phone]  ATTENDING ATTESTATION:  Same-day note no charge  Heberto Sturdevant,MD    To contact Stroke Continuity provider, please refer to [email or web address]. After hours, contact General Neurology

## 2022-10-25 NOTE — Assessment & Plan Note (Signed)
Holding home BP meds in setting of acute stroke.

## 2022-10-26 ENCOUNTER — Other Ambulatory Visit (HOSPITAL_COMMUNITY): Payer: Self-pay

## 2022-10-26 LAB — RPR: RPR Ser Ql: NONREACTIVE

## 2022-10-26 MED ORDER — SPIRONOLACTONE 50 MG PO TABS: 50.0000 mg | ORAL_TABLET | Freq: Every day | ORAL | Status: AC

## 2022-10-26 MED ORDER — CARVEDILOL 25 MG PO TABS: 25.0000 mg | ORAL_TABLET | Freq: Two times a day (BID) | ORAL | Status: AC

## 2022-10-26 MED ORDER — CLOPIDOGREL BISULFATE 75 MG PO TABS
75.0000 mg | ORAL_TABLET | Freq: Every day | ORAL | 0 refills | Status: AC
  Filled 2022-10-26: qty 21, 21d supply, fill #0

## 2022-10-26 MED ORDER — HYDRALAZINE HCL 25 MG PO TABS: 25.0000 mg | ORAL_TABLET | Freq: Three times a day (TID) | ORAL | Status: AC | PRN

## 2022-10-26 MED ORDER — ASPIRIN 81 MG PO TBEC
81.0000 mg | DELAYED_RELEASE_TABLET | Freq: Every day | ORAL | 0 refills | Status: AC
  Filled 2022-10-26: qty 30, 30d supply, fill #0

## 2022-10-26 MED ORDER — LISINOPRIL 40 MG PO TABS: 40.0000 mg | ORAL_TABLET | Freq: Every day | ORAL | Status: AC

## 2022-10-26 MED ORDER — ATORVASTATIN CALCIUM 40 MG PO TABS
40.0000 mg | ORAL_TABLET | Freq: Every day | ORAL | 0 refills | Status: AC
  Filled 2022-10-26: qty 30, 30d supply, fill #0

## 2022-10-26 NOTE — Progress Notes (Signed)
Discharge instructions reviewed with pt, family at bedside.  Able to answer some of pt's questions, family had other questions--deferred them to pt's nurse caring for him today.  Pt able to review signs and symptoms of a stroke and knows when to call 911.  Also discussed the importance of weighing self daily and following the CHF instructions in his d/c instructions and when to call the MD.  Encouraged and stressed the importance of being compliant with his medications and taking as directed.  Diet low sodium and heart healthy (low salt/low fat) also discussed with pt and family.  Copy of instructions given to pt. Scripts filled by [facility] TOC pharmacy and delivered to pt's room.   Primary RN to speak with pt/family and pt will be ready for discharge.

## 2022-10-26 NOTE — TOC Transition Note (Addendum)
Transition of Care [facility]) - CM/SW Discharge Note   Patient Details  Name: Bradley Wilson MRN: [id number] Date of Birth: 12-31-1975  [facility] CM/SW Contact:  Kermit Balo, RN Phone Number: 10/26/2022, 12:58 PM   Clinical Narrative:    Pt is from home with his children: 14, 16, 20. Pt states his exspouse is in and out also.  He drives self as needed. He manages his own medications.  Pt has PCP but unable to afford to continue to see his due to lack of insurance. CM has gotten him set up with [employer] to assist him have a PCP with sliding scale cost. They will also assist with the cost of medications. Information on the AVS.  Cane for home ordered through [employer] charity services and will be delivered to the room. Pt states his disability is pending.  Pt able to afford d/c medications today. Pts ex spouse will provide transport home today.    Final next level of care: Home/Self Care Barriers to Discharge: Inadequate or no insurance, Barriers Unresolved (comment)   Patient Goals and CMS Choice     Choice offered to / list presented to : Patient  Discharge Placement                       Discharge Plan and Services                DME Arranged: Gilmer Mor DME Agency: [employer] Date DME Agency Contacted: 10/26/22   Representative spoke with at DME Agency: Lawernce Keas            Social Determinants of Health (SDOH) Interventions     Readmission Risk Interventions     No data to display

## 2022-10-26 NOTE — Progress Notes (Addendum)
STROKE TEAM PROGRESS NOTE   INTERVAL HISTORY Patient is seen in his room with no family at the bedside.  He reports that his right sided numbness is 93% better.  He is ready for discharge.  Discussed need for long term cardiac monitoring to rule out atrial fibrillation with patient, and he will arrange this as an outpatient when he regains health insurance.  Vitals:   10/25/22 1600 10/25/22 1958 10/26/22 0320 10/26/22 0759  BP: (!) 145/91 (!) 145/86 (!) 139/91 (!) 149/97  Pulse: 82 71 64 66  Resp: 16 17 16 19   Temp: 98.2 F (36.8 C) 98.8 F (37.1 C) 97.8 F (36.6 C) 98.3 F (36.8 C)  TempSrc: Oral Oral Oral Oral  SpO2: 100% 97% 95% 99%   CBC:  Recent Labs  Lab 10/23/22 2237 10/24/22 1756  WBC 6.8 6.0  NEUTROABS 3.1 3.5  HGB 13.1 12.9*  HCT 39.3 38.5*  MCV 87.5 86.1  PLT 255 243    Basic Metabolic Panel:  Recent Labs  Lab 10/23/22 2237 10/24/22 1756  NA 142 141  K 4.3 4.3  CL 105 109  CO2 25 25  GLUCOSE 93 101*  BUN 18 20  CREATININE 1.38* 1.17  CALCIUM 9.4 9.1    Lipid Panel:  Recent Labs  Lab 10/25/22 0336  CHOL 194  TRIG 242*  HDL 32*  CHOLHDL 6.1  VLDL 48*  LDLCALC 114*    HgbA1c: No results for input(s): "HGBA1C" in the last 168 hours. Urine Drug Screen: No results for input(s): "LABOPIA", "COCAINSCRNUR", "LABBENZ", "AMPHETMU", "THCU", "LABBARB" in the last 168 hours.  Alcohol Level No results for input(s): "ETH" in the last 168 hours.  IMAGING past 24 hours ECHOCARDIOGRAM COMPLETE  Result Date: 10/25/2022    ECHOCARDIOGRAM REPORT   Patient Name:   TRAVIS MASTEL Date of Exam: 10/25/2022 Medical Rec #:  10/27/2022    Height:       72.0 in Accession #:    [id number]   Weight:       315.0 lb Date of Birth:  18-Jun-1976     BSA:          2.584 m Patient Age:    46 years     BP:           134/91 mmHg Patient Gender: M            HR:           65 bpm. Exam Location:  Inpatient Procedure: 2D Echo, Color Doppler, Cardiac Doppler and Intracardiac             Opacification Agent Indications:    Stroke i63.9  History:        [name], most                 recent 01/27/2016. CHF; Risk Factors:Hypertension and Sleep                 Apnea.  Sonographer:    01/29/2016 Senior RDCS Referring Phys: [phone] JARED M GARDNER  Sonographer Comments: Technically difficult study due to patient body habitus. IMPRESSIONS  1. Technically difficult study; LV function appears to be normal; if clinically indicated, TEE would have greater sensitivity for source of embolus.  2. Left ventricular ejection fraction, by estimation, is 55 to 60%. The left ventricle has normal function. The left ventricle has no regional wall motion abnormalities. The left ventricular internal cavity size was severely dilated. Left ventricular diastolic parameters  are consistent with Grade I diastolic dysfunction (impaired relaxation).  3. Right ventricular systolic function is normal. The right ventricular size is normal.  4. Left atrial size was mildly dilated.  5. The mitral valve is normal in structure. No evidence of mitral valve regurgitation. No evidence of mitral stenosis.  6. The aortic valve is tricuspid. Aortic valve regurgitation is not visualized. No aortic stenosis is present. FINDINGS  Left Ventricle: Left ventricular ejection fraction, by estimation, is 55 to 60%. The left ventricle has normal function. The left ventricle has no regional wall motion abnormalities. Definity contrast agent was given IV to delineate the left ventricular  endocardial borders. The left ventricular internal cavity size was severely dilated. There is no left ventricular hypertrophy. Left ventricular diastolic parameters are consistent with Grade I diastolic dysfunction (impaired relaxation). Right Ventricle: The right ventricular size is normal. Right ventricular systolic function is normal. Left Atrium: Left atrial size was mildly dilated. Right Atrium: Right atrial size was normal in  size. Pericardium: There is no evidence of pericardial effusion. Mitral Valve: The mitral valve is normal in structure. No evidence of mitral valve regurgitation. No evidence of mitral valve stenosis. Tricuspid Valve: The tricuspid valve is normal in structure. Tricuspid valve regurgitation is not demonstrated. No evidence of tricuspid stenosis. Aortic Valve: The aortic valve is tricuspid. Aortic valve regurgitation is not visualized. No aortic stenosis is present. Aortic valve mean gradient measures 6.0 mmHg. Aortic valve peak gradient measures 10.5 mmHg. Aortic valve area, by VTI measures 2.87  cm. Pulmonic Valve: The pulmonic valve was normal in structure. Pulmonic valve regurgitation is not visualized. No evidence of pulmonic stenosis. Aorta: The aortic root is normal in size and structure. Venous: The inferior vena cava was not well visualized. IAS/Shunts: The interatrial septum was not well visualized. Additional Comments: Technically difficult study; LV function appears to be normal; if clinically indicated, TEE would have greater sensitivity for source of embolus.  LEFT VENTRICLE PLAX 2D LVIDd:         6.60 cm   Diastology LVIDs:         4.20 cm   LV e' medial:    7.07 cm/s LV PW:         0.90 cm   LV E/e' medial:  11.4 LV IVS:        0.90 cm   LV e' lateral:   8.49 cm/s LVOT diam:     2.30 cm   LV E/e' lateral: 9.5 LV SV:         93 LV SV Index:   36 LVOT Area:     4.15 cm  RIGHT VENTRICLE RV S prime:     12.60 cm/s TAPSE (M-mode): 2.7 cm LEFT ATRIUM              Index        RIGHT ATRIUM           Index LA diam:        5.30 cm  2.05 cm/m   RA Area:     23.90 cm LA Vol (A2C):   88.4 ml  34.21 ml/m  RA Volume:   73.60 ml  28.48 ml/m LA Vol (A4C):   117.0 ml 45.27 ml/m LA Biplane Vol: 103.0 ml 39.86 ml/m  AORTIC VALVE AV Area (Vmax):    2.82 cm AV Area (Vmean):   2.95 cm AV Area (VTI):     2.87 cm AV Vmax:  162.00 cm/s AV Vmean:          116.000 cm/s AV VTI:            0.326 m AV Peak  Grad:      10.5 mmHg AV Mean Grad:      6.0 mmHg LVOT Vmax:         110.00 cm/s LVOT Vmean:        82.300 cm/s LVOT VTI:          0.225 m LVOT/AV VTI ratio: 0.69  AORTA Ao Root diam: 3.10 cm MITRAL VALVE MV Area (PHT): 4.01 cm    SHUNTS MV Decel Time: 189 msec    Systemic VTI:  0.22 m MV E velocity: 80.40 cm/s  Systemic Diam: 2.30 cm MV A velocity: 88.10 cm/s MV E/A ratio:  0.91 Olga Millers MD Electronically signed by Olga Millers MD Signature Date/Time: 10/25/2022/12:44:30 PM    Final     PHYSICAL EXAM Constitutional: Appears well-developed and well-nourished.  Respiratory: Effort normal, non-labored breathing   Neuro: Mental Status: Patient is awake, alert, oriented to person, place, month, year, and situation. Patient is able to give a clear and coherent history. No signs of aphasia or neglect Cranial Nerves: II: Pupils are equal, round, and reactive to light.   III,IV, VI: EOMI without ptosis or diploplia.  VII: Facial movement is symmetric VIII: hearing is intact to voice X: Phonation is normal XI: Shoulder shrug is symmetric. XII: tongue is midline without atrophy or fasciculations.  Motor: Tone is normal. Bulk is normal. 5/5 strength was present in all four extremities.  Mild pronation of the left upper extremity without drift Sensory: Sensation is symmetric to light touch in the arms and legs but he reports some mild loss of fine touch in the right arm and leg  Cerebellar: FNF are intact bilaterally  Gait:  Deferred  ASSESSMENT/PLAN Mr. Rajveer Handler is a 46 y.o. male with history of hypertensive cardiomyopathy (2014, most recent ECHO in 2017 with recovered EF and left atrial enlargement), CKD stage III, hypertension, obesity on Vigo V, obstructive sleep apnea on CPAP, remote smoking history (5.5 pack years) presenting with an episode of transient dizziness, right sided numbness.  OK to discharge patient today with follow up in stroke clinic in 6-8 weeks.  Stroke:  Left  thalamic stroke  Etiology:  small vessel disease  Code Stroke CT head No acute abnormality.  1. No acute intracranial process. 2. Multiple soft tissue density lesions along the scalp are nonspecific. One of the lesions along the right frontal scalp / vertex has a cortical defect. Recommend correlation with physical exam to exclude the possibility of malignancy. 3. Nonspecific sclerotic lesion along the left parietal calvarium. CTA head & neck  Normal CTA head and neck MRI   Small acute infarct of the left thalamus. No hemorrhage or mass effect. 2D Echo EF 55-60%, Grade 1 diastolic dysfunction, LV severely dilated.  LDL 114 HgbA1c pending VTE prophylaxis - Lovenox    Diet   Diet Heart Room service appropriate? Yes; Fluid consistency: Thin   No antithrombotic prior to admission, now on aspirin 81 mg daily and clopidogrel 75 mg daily for 3 weeks and the ASA 81mg  alone Therapy recommendations:  no PT/OT follow up Disposition:  Pending  Hypertension Home meds:  Coreg, hydralazine, lisinopril, spironolactone Stable Permissive hypertension (OK if < 220/120) but gradually normalize in 3-4 days Long-term BP goal normotensive  Hyperlipidemia Home meds:  None LDL 114, goal < 70 Add  atorvastatin 40mg  Continue statin at discharge  Diabetes type II Uncontrolled Home meds:  semaglutide HgbA1c pending, goal < 7.0 CBGs SSI  Other Stroke Risk Factors Cigarette smoker, advised to stop smoking ETOH use, alcohol level No results found for requested labs within last 1095 days., advised to drink no more than 2 drink(s) a day Obesity, There is no height or weight on file to calculate BMI., BMI >/= 30 associated with increased stroke risk, recommend weight loss, diet and exercise as appropriate   Other Active Problems Hypertensive cardiomyopathy Previously followed by [facility]. Repeat echo shows EF 55-60%  Hospital day # 0  Patient seen and examined by NP/APP with MD. MD to update note as  needed.   Cortney E Ernestina Columbia , MSN, AGACNP-BC [facility] See Amion for schedule and pager information 10/26/2022 10:56 AM  ATTENDING ATTESTATION:  46 year old with left thalamic stroke residual symptoms of right lip and right arm leg numbness that has significantly improved and about 90% back to normal.  Exam is otherwise unremarkable and normal.  No weakness.  Echo is unremarkable supple left mildly dilated atrium.  Recommend outpatient event monitor for 30 days.  Aspirin Plavix for 3 weeks then aspirin alone.  Discussed at length about diet exercise and need to change his lifestyle to prevent future strokes.  He understands this.  Okay to discharge.  Neurology will sign off.  Needs to follow-up in stroke clinic after discharge.  Dr. Viviann Spare evaluated pt independently, reviewed imaging, chart, labs. Discussed and formulated plan with the Resident/APP. Changes were made to the note where appropriate. Please see APP/resident note above for details.   Janifer Gieselman,MD      To contact Stroke Continuity provider, please refer to [email or web address]. After hours, contact General Neurology

## 2022-10-26 NOTE — Plan of Care (Signed)
Pt friendly alert and oriented x4. NIH 0 and mNIH 0. Lipitor and Tramadol given to pt per orders and explained to pt before giving them to him. Pt voiced understanding on what was taught and didn't voice any questions.

## 2022-10-26 NOTE — Discharge Summary (Signed)
Physician Discharge Summary  Bradley Wilson [id number] DOB: [birth year] DOA: 10/24/2022  PCP: Bradley Close, MD  Admit date: 10/24/2022 Discharge date: 10/26/2022  Admitted From: Home Disposition: Home  Recommendations for Outpatient Follow-up:  Follow up with PCP in 1 week with repeat CBC/BMP Outpatient follow-up with neurology. Follow up in ED if symptoms worsen or new appear   Home Health: No Equipment/Devices: None  Discharge Condition: Stable CODE STATUS: Full Diet recommendation: Heart healthy  Brief/Interim Summary: 46 y.o. male with medical history significant of HFrEF 25% EF in 2014, recovery of EF to 50-55% as of most recent 2d Echo in 2017, hypertension, morbid obesity presented with right-sided numbness.  Neurology was consulted.  He was found to have left thalamic stroke.  He has been started on aspirin, Plavix and Lipitor: Neurology recommending aspirin and Plavix for 3 weeks then aspirin alone.  Will need outpatient follow-up with neurology.  Neurology has cleared the patient for discharge.  He has tolerated PT.  Discharge patient home today.  Discharge Diagnoses:  Acute left thalamic stroke Hyperlipidemia -Presented with right subdural numbness. - Neurology was consulted.  He was found to have left thalamic stroke.  He has been started on aspirin, Plavix and Lipitor: Neurology recommending aspirin and Plavix for 3 weeks then aspirin alone.  Will need outpatient follow-up with neurology.  Neurology has cleared the patient for discharge.  He has tolerated PT.  Discharge patient home today. -LDL 114.  Echo showed EF of 55 to 60% with grade 1 diastolic dysfunction. -Tolerating diet. -Allow for permissive hypertension and gradually normalize blood pressure in 5 to 7 days.  Hypertension -Antihypertensives on hold.  Gradually resume antihypertensives at home in the next 5 to 7 days.  Outpatient follow-up with PCP.  Chronic diastolic heart failure History of chronic  systolic heart failure: Resolved -Has history of low EF of 25% in 2014 but his EF already recovered to 50 to 55% in 2017.  EF 55 to 60% again during this hospitalization. -Resume goal-directed medical therapy in the next few days completion of permissive hypertension -Outpatient follow-up with cardiology  Morbid obesity -Outpatient follow-up  Normocytic anemia -Questionable cause.  Outpatient follow-up.   Discharge Instructions  Allergies as of 10/26/2022       Reactions   Bee Venom Anaphylaxis   Shellfish Allergy Anaphylaxis        Medication List     TAKE these medications    acetaminophen 500 MG tablet Commonly known as: TYLENOL Take 1,000 mg by mouth every 6 (six) hours as needed for mild pain or moderate pain.   aspirin EC 81 MG tablet Take 1 tablet (81 mg total) by mouth daily. Swallow whole. Start taking on: October 27, 2022   atorvastatin 40 MG tablet Commonly known as: LIPITOR Take 1 tablet (40 mg total) by mouth at bedtime.   carvedilol 25 MG tablet Commonly known as: COREG Take 1 tablet (25 mg total) by mouth in the morning and at bedtime. Start taking on: October 30, 2022 What changed:  See the new instructions. These instructions start on October 30, 2022. If you are unsure what to do until then, ask your doctor or other care provider.   clopidogrel 75 MG tablet Commonly known as: PLAVIX Take 1 tablet (75 mg total) by mouth daily. Start taking on: October 27, 2022   hydrALAZINE 25 MG tablet Commonly known as: APRESOLINE Take 1 tablet (25 mg total) by mouth 3 (three) times daily as needed (high blood pressure). Start taking on:  October 31, 2022 What changed: These instructions start on October 31, 2022. If you are unsure what to do until then, ask your doctor or other care provider.   lisinopril 40 MG tablet Commonly known as: ZESTRIL Take 1 tablet (40 mg total) by mouth daily. Start taking on: October 29, 2022 What changed:  how much  to take when to take this These instructions start on October 29, 2022. If you are unsure what to do until then, ask your doctor or other care provider.   MULTI-VITAMIN DAILY PO Take 1 tablet by mouth daily. Reported on 01/15/2016   naproxen sodium 220 MG tablet Commonly known as: ALEVE Take 440 mg by mouth daily as needed (pain).   spironolactone 50 MG tablet Commonly known as: ALDACTONE Take 1 tablet (50 mg total) by mouth daily. Start taking on: October 31, 2022 What changed: These instructions start on October 31, 2022. If you are unsure what to do until then, ask your doctor or other care provider.   traMADol 50 MG tablet Commonly known as: ULTRAM Take 100 mg by mouth 2 (two) times daily.   Wegovy 1.7 MG/0.75ML Soaj Generic drug: Semaglutide-Weight Management Inject 1.7 mg into the skin once a week.         Follow-up Information     Bradley Close, MD .   Specialty: [facility] Contact information: [address] [place] [address] [phone]         [place] .   Specialty: Emergency Medicine Contact information: [id number] [place] [address] [phone]               Allergies  Allergen Reactions   Bee Venom Anaphylaxis   Shellfish Allergy Anaphylaxis    Consultations: Neurology   Procedures/Studies: ECHOCARDIOGRAM COMPLETE  Result Date: 10/25/2022    ECHOCARDIOGRAM REPORT   Patient Name:   Bradley Wilson Date of Exam: 10/25/2022 Medical Rec #:  [id number]    Height:       72.0 in Accession #:    [id number]   Weight:       315.0 lb Date of Birth:  08/26/1976     BSA:          2.584 m Patient Age:    41 years     BP:           134/91 mmHg Patient Gender: M            HR:           65 bpm. Exam Location:  Inpatient Procedure: 2D Echo, Color Doppler, Cardiac Doppler and Intracardiac            Opacification Agent Indications:    Stroke  i63.9  History:        [name], most                 recent 01/27/2016. CHF; Risk Factors:Hypertension and Sleep                 Apnea.  Sonographer:    Raquel Sarna Senior RDCS Referring Phys: [phone] Bradley Wilson  Sonographer Comments: Technically difficult study due to patient body habitus. IMPRESSIONS  1. Technically difficult study; LV function appears to be normal; if clinically indicated, TEE would have greater sensitivity for source of embolus.  2. Left ventricular ejection fraction, by estimation, is 55 to 60%. The left ventricle has normal  function. The left ventricle has no regional wall motion abnormalities. The left ventricular internal cavity size was severely dilated. Left ventricular diastolic parameters are consistent with Grade I diastolic dysfunction (impaired relaxation).  3. Right ventricular systolic function is normal. The right ventricular size is normal.  4. Left atrial size was mildly dilated.  5. The mitral valve is normal in structure. No evidence of mitral valve regurgitation. No evidence of mitral stenosis.  6. The aortic valve is tricuspid. Aortic valve regurgitation is not visualized. No aortic stenosis is present. FINDINGS  Left Ventricle: Left ventricular ejection fraction, by estimation, is 55 to 60%. The left ventricle has normal function. The left ventricle has no regional wall motion abnormalities. Definity contrast agent was given IV to delineate the left ventricular  endocardial borders. The left ventricular internal cavity size was severely dilated. There is no left ventricular hypertrophy. Left ventricular diastolic parameters are consistent with Grade I diastolic dysfunction (impaired relaxation). Right Ventricle: The right ventricular size is normal. Right ventricular systolic function is normal. Left Atrium: Left atrial size was mildly dilated. Right Atrium: Right atrial size was normal in size. Pericardium: There is no evidence of  pericardial effusion. Mitral Valve: The mitral valve is normal in structure. No evidence of mitral valve regurgitation. No evidence of mitral valve stenosis. Tricuspid Valve: The tricuspid valve is normal in structure. Tricuspid valve regurgitation is not demonstrated. No evidence of tricuspid stenosis. Aortic Valve: The aortic valve is tricuspid. Aortic valve regurgitation is not visualized. No aortic stenosis is present. Aortic valve mean gradient measures 6.0 mmHg. Aortic valve peak gradient measures 10.5 mmHg. Aortic valve area, by VTI measures 2.87  cm. Pulmonic Valve: The pulmonic valve was normal in structure. Pulmonic valve regurgitation is not visualized. No evidence of pulmonic stenosis. Aorta: The aortic root is normal in size and structure. Venous: The inferior vena cava was not well visualized. IAS/Shunts: The interatrial septum was not well visualized. Additional Comments: Technically difficult study; LV function appears to be normal; if clinically indicated, TEE would have greater sensitivity for source of embolus.  LEFT VENTRICLE PLAX 2D LVIDd:         6.60 cm   Diastology LVIDs:         4.20 cm   LV e' medial:    7.07 cm/s LV PW:         0.90 cm   LV E/e' medial:  11.4 LV IVS:        0.90 cm   LV e' lateral:   8.49 cm/s LVOT diam:     2.30 cm   LV E/e' lateral: 9.5 LV SV:         93 LV SV Index:   36 LVOT Area:     4.15 cm  RIGHT VENTRICLE RV S prime:     12.60 cm/s TAPSE (M-mode): 2.7 cm LEFT ATRIUM              Index        RIGHT ATRIUM           Index LA diam:        5.30 cm  2.05 cm/m   RA Area:     23.90 cm LA Vol (A2C):   88.4 ml  34.21 ml/m  RA Volume:   73.60 ml  28.48 ml/m LA Vol (A4C):   117.0 ml 45.27 ml/m LA Biplane Vol: 103.0 ml 39.86 ml/m  AORTIC VALVE AV Area (Vmax):    2.82 cm AV Area (Vmean):  2.95 cm AV Area (VTI):     2.87 cm AV Vmax:           162.00 cm/s AV Vmean:          116.000 cm/s AV VTI:            0.326 m AV Peak Grad:      10.5 mmHg AV Mean Grad:      6.0  mmHg LVOT Vmax:         110.00 cm/s LVOT Vmean:        82.300 cm/s LVOT VTI:          0.225 m LVOT/AV VTI ratio: 0.69  AORTA Ao Root diam: 3.10 cm MITRAL VALVE MV Area (PHT): 4.01 cm    SHUNTS MV Decel Time: 189 msec    Systemic VTI:  0.22 m MV E velocity: 80.40 cm/s  Systemic Diam: 2.30 cm MV A velocity: 88.10 cm/s MV E/A ratio:  0.91 Kirk Ruths MD Electronically signed by Kirk Ruths MD Signature Date/Time: 10/25/2022/12:44:30 PM    Final    CT ANGIO HEAD NECK W WO CM  Result Date: 10/25/2022 CLINICAL DATA:  Numbness EXAM: CT ANGIOGRAPHY HEAD AND NECK TECHNIQUE: Multidetector CT imaging of the head and neck was performed using the standard protocol during bolus administration of intravenous contrast. Multiplanar CT image reconstructions and MIPs were obtained to evaluate the vascular anatomy. Carotid stenosis measurements (when applicable) are obtained utilizing NASCET criteria, using the distal internal carotid diameter as the denominator. RADIATION DOSE REDUCTION: This exam was performed according to the departmental dose-optimization program which includes automated exposure control, adjustment of the mA and/or kV according to patient size and/or use of iterative reconstruction technique. CONTRAST:  41mL OMNIPAQUE IOHEXOL 350 MG/ML SOLN COMPARISON:  None Available. FINDINGS: CTA NECK FINDINGS SKELETON: There is no bony spinal canal stenosis. No lytic or blastic lesion. OTHER NECK: Normal pharynx, larynx and major salivary glands. No cervical lymphadenopathy. Unremarkable thyroid gland. UPPER CHEST: No pneumothorax or pleural effusion. No nodules or masses. AORTIC ARCH: There is no calcific atherosclerosis of the aortic arch. There is no aneurysm, dissection or hemodynamically significant stenosis of the visualized portion of the aorta. Conventional 3 vessel aortic branching pattern. The visualized proximal subclavian arteries are widely patent. RIGHT CAROTID SYSTEM: Normal without aneurysm,  dissection or stenosis. LEFT CAROTID SYSTEM: Normal without aneurysm, dissection or stenosis. VERTEBRAL ARTERIES: Left dominant configuration. Both origins are clearly patent. There is no dissection, occlusion or flow-limiting stenosis to the skull base (V1-V3 segments). CTA HEAD FINDINGS POSTERIOR CIRCULATION: --Vertebral arteries: Normal V4 segments. --Inferior cerebellar arteries: Normal. --Basilar artery: Normal. --Superior cerebellar arteries: Normal. --Posterior cerebral arteries (PCA): Normal. ANTERIOR CIRCULATION: --Intracranial internal carotid arteries: Normal. --Anterior cerebral arteries (ACA): Normal. Both A1 segments are present. Patent anterior communicating artery (a-comm). --Middle cerebral arteries (MCA): Normal. VENOUS SINUSES: As permitted by contrast timing, patent. ANATOMIC VARIANTS: None Review of the MIP images confirms the above findings. IMPRESSION: Normal CTA of the head and neck. Electronically Signed   By: Ulyses Jarred M.D.   On: 10/25/2022 02:23   MR BRAIN WO CONTRAST  Result Date: 10/24/2022 CLINICAL DATA:  Acute neurologic deficit EXAM: MRI HEAD WITHOUT CONTRAST TECHNIQUE: Multiplanar, multiecho pulse sequences of the brain and surrounding structures were obtained without intravenous contrast. COMPARISON:  None Available. FINDINGS: Brain: There is a small acute infarct of the left thalamus. No acute or chronic hemorrhage. There is multifocal hyperintense T2-weighted signal within the white matter. Parenchymal volume and CSF spaces  are normal. The midline structures are normal. Vascular: Major flow voids are preserved. Skull and upper cervical spine: Normal calvarium and skull base. Visualized upper cervical spine and soft tissues are normal. Sinuses/Orbits:No paranasal sinus fluid levels or advanced mucosal thickening. No mastoid or middle ear effusion. Normal orbits. IMPRESSION: Small acute infarct of the left thalamus. No hemorrhage or mass effect. Electronically Signed   By:  Deatra Robinson M.D.   On: 10/24/2022 22:52   CT Head Wo Contrast  Result Date: 10/24/2022 CLINICAL DATA:  Right face numbness. EXAM: CT HEAD WITHOUT CONTRAST TECHNIQUE: Contiguous axial images were obtained from the base of the skull through the vertex without intravenous contrast. RADIATION DOSE REDUCTION: This exam was performed according to the departmental dose-optimization program which includes automated exposure control, adjustment of the mA and/or kV according to patient size and/or use of iterative reconstruction technique. COMPARISON:  None Available. FINDINGS: Brain: No evidence of acute infarction, hemorrhage, hydrocephalus, extra-axial collection or mass lesion/mass effect. Vascular: No hyperdense vessel or unexpected calcification. Skull: Negative for fracture. Nonspecific sclerotic lesion along the left parietal calvarium (2, image 27). Sinuses/Orbits: No acute finding. Other: 1.5 cm soft tissue density lesion along the midline posterior scalp, nonspecific, possibly a sebaceous cyst. Interval for additional similar-appearing lesions, some of which with associated cutaneous irregularity (series 2, image 28, 29). There is an additional soft tissue density lesion in the posterior auricular soft tissues on the left (series 2, image 8). IMPRESSION: 1. No acute intracranial process. 2. Multiple soft tissue density lesions along the scalp are nonspecific. One of the lesions along the right frontal scalp / vertex has a cortical defect. Recommend correlation with physical exam to exclude the possibility of malignancy. 3. Nonspecific sclerotic lesion along the left parietal calvarium. Electronically Signed   By: Lorenza Cambridge M.D.   On: 10/24/2022 18:31   DG Chest 2 View  Result Date: 10/23/2022 CLINICAL DATA:  Dizziness. EXAM: CHEST - 2 VIEW COMPARISON:  Chest x-ray 01/23/2013 FINDINGS: The heart is mildly enlarged, unchanged. There is no focal lung infiltrate, pleural effusion or pneumothorax. There  are degenerative changes of the spine. IMPRESSION: 1. No active cardiopulmonary disease. 2. Mild cardiomegaly. Electronically Signed   By: Darliss Cheney M.D.   On: 10/23/2022 22:54      Subjective: Patient seen and examined at bedside.  Feels better and wants to go home today.  Numbness has improved.  No fever, nausea or vomiting reported. Discharge Exam: Vitals:   10/26/22 0320 10/26/22 0759  BP: (!) 139/91 (!) 149/97  Pulse: 64 66  Resp: 16 19  Temp: 97.8 F (36.6 C) 98.3 F (36.8 C)  SpO2: 95% 99%    General: Pt is alert, awake, not in acute distress.  Currently on room air. Cardiovascular: rate controlled, S1/S2 + Respiratory: bilateral decreased breath sounds at bases Abdominal: Soft, morbidly obese, NT, ND, bowel sounds + Extremities: Trace lower extremity edema; no cyanosis    The results of significant diagnostics from this hospitalization (including imaging, microbiology, ancillary and laboratory) are listed below for reference.     Microbiology: No results found for this or any previous visit (from the past 240 hour(s)).   Labs: BNP (last 3 results) No results for input(s): "BNP" in the last 8760 hours. Basic Metabolic Panel: Recent Labs  Lab 10/23/22 2237 10/24/22 1756  NA 142 141  K 4.3 4.3  CL 105 109  CO2 25 25  GLUCOSE 93 101*  BUN 18 20  CREATININE 1.38* 1.17  CALCIUM 9.4 9.1   Liver Function Tests: No results for input(s): "AST", "ALT", "ALKPHOS", "BILITOT", "PROT", "ALBUMIN" in the last 168 hours. No results for input(s): "LIPASE", "AMYLASE" in the last 168 hours. No results for input(s): "AMMONIA" in the last 168 hours. CBC: Recent Labs  Lab 10/23/22 2237 10/24/22 1756  WBC 6.8 6.0  NEUTROABS 3.1 3.5  HGB 13.1 12.9*  HCT 39.3 38.5*  MCV 87.5 86.1  PLT 255 243   Cardiac Enzymes: No results for input(s): "CKTOTAL", "CKMB", "CKMBINDEX", "TROPONINI" in the last 168 hours. BNP: Invalid input(s): "POCBNP" CBG: No results for input(s):  "GLUCAP" in the last 168 hours. D-Dimer No results for input(s): "DDIMER" in the last 72 hours. Hgb A1c No results for input(s): "HGBA1C" in the last 72 hours. Lipid Profile Recent Labs    10/25/22 0336  CHOL 194  HDL 32*  LDLCALC 114*  TRIG 242*  CHOLHDL 6.1   Thyroid function studies Recent Labs    10/24/22 0123  TSH 2.189   Anemia work up No results for input(s): "VITAMINB12", "FOLATE", "FERRITIN", "TIBC", "IRON", "RETICCTPCT" in the last 72 hours. Urinalysis    Component Value Date/Time   COLORURINE YELLOW 10/23/2022 2217   APPEARANCEUR CLEAR 10/23/2022 2217   LABSPEC 1.029 10/23/2022 2217   PHURINE 5.0 10/23/2022 2217   GLUCOSEU NEGATIVE 10/23/2022 2217   HGBUR NEGATIVE 10/23/2022 2217   [place] NEGATIVE 10/23/2022 2217   [place] NEGATIVE 10/23/2022 2217   PROTEINUR NEGATIVE 10/23/2022 2217   NITRITE NEGATIVE 10/23/2022 2217   LEUKOCYTESUR NEGATIVE 10/23/2022 2217   Sepsis Labs Recent Labs  Lab 10/23/22 2237 10/24/22 1756  WBC 6.8 6.0   Microbiology No results found for this or any previous visit (from the past 240 hour(s)).   Time coordinating discharge: 35 minutes  SIGNED:   Aline August, MD  [facility] 10/26/2022, 10:21 AM

## 2022-10-27 LAB — HEMOGLOBIN A1C
Hgb A1c MFr Bld: 5.1 % (ref 4.8–5.6)
Mean Plasma Glucose: 100 mg/dL
